# Patient Record
Sex: Female | Born: 1935 | Race: White | Hispanic: No | Smoking: Never smoker
Health system: Southern US, Community
[De-identification: ages and names within clinical notes are randomized; demographics above are authoritative.]

## PROBLEM LIST (undated history)

## (undated) DIAGNOSIS — I2510 Atherosclerotic heart disease of native coronary artery without angina pectoris: Secondary | ICD-10-CM

## (undated) DIAGNOSIS — F0390 Unspecified dementia without behavioral disturbance: Secondary | ICD-10-CM

## (undated) DIAGNOSIS — M1990 Unspecified osteoarthritis, unspecified site: Secondary | ICD-10-CM

## (undated) DIAGNOSIS — E785 Hyperlipidemia, unspecified: Secondary | ICD-10-CM

## (undated) DIAGNOSIS — I4891 Unspecified atrial fibrillation: Secondary | ICD-10-CM

## (undated) DIAGNOSIS — I2699 Other pulmonary embolism without acute cor pulmonale: Secondary | ICD-10-CM

## (undated) DIAGNOSIS — E079 Disorder of thyroid, unspecified: Secondary | ICD-10-CM

## (undated) DIAGNOSIS — I10 Essential (primary) hypertension: Secondary | ICD-10-CM

## (undated) DIAGNOSIS — I219 Acute myocardial infarction, unspecified: Secondary | ICD-10-CM

## (undated) DIAGNOSIS — F0280 Dementia in other diseases classified elsewhere without behavioral disturbance: Secondary | ICD-10-CM

## (undated) DIAGNOSIS — G309 Alzheimer's disease, unspecified: Secondary | ICD-10-CM

## (undated) DIAGNOSIS — F419 Anxiety disorder, unspecified: Secondary | ICD-10-CM

## (undated) DIAGNOSIS — K219 Gastro-esophageal reflux disease without esophagitis: Secondary | ICD-10-CM

## (undated) DIAGNOSIS — K449 Diaphragmatic hernia without obstruction or gangrene: Secondary | ICD-10-CM

## (undated) DIAGNOSIS — K5730 Diverticulosis of large intestine without perforation or abscess without bleeding: Secondary | ICD-10-CM

## (undated) HISTORY — PX: ABDOMINAL SURGERY: SHX537

## (undated) HISTORY — PX: CHOLECYSTECTOMY: SHX55

---

## 2001-01-15 ENCOUNTER — Ambulatory Visit (HOSPITAL_COMMUNITY): Admission: RE | Admit: 2001-01-15 | Discharge: 2001-01-15 | Payer: Self-pay | Admitting: Internal Medicine

## 2001-01-15 ENCOUNTER — Encounter: Payer: Self-pay | Admitting: Internal Medicine

## 2001-12-01 ENCOUNTER — Emergency Department (HOSPITAL_COMMUNITY): Admission: EM | Admit: 2001-12-01 | Discharge: 2001-12-01 | Payer: Self-pay | Admitting: *Deleted

## 2001-12-01 ENCOUNTER — Encounter: Payer: Self-pay | Admitting: *Deleted

## 2002-01-16 ENCOUNTER — Encounter: Payer: Self-pay | Admitting: Internal Medicine

## 2002-01-16 ENCOUNTER — Ambulatory Visit (HOSPITAL_COMMUNITY): Admission: RE | Admit: 2002-01-16 | Discharge: 2002-01-16 | Payer: Self-pay | Admitting: Internal Medicine

## 2002-02-28 ENCOUNTER — Ambulatory Visit (HOSPITAL_COMMUNITY): Admission: RE | Admit: 2002-02-28 | Discharge: 2002-02-28 | Payer: Self-pay | Admitting: Internal Medicine

## 2002-02-28 ENCOUNTER — Encounter (INDEPENDENT_AMBULATORY_CARE_PROVIDER_SITE_OTHER): Payer: Self-pay | Admitting: Internal Medicine

## 2002-04-09 ENCOUNTER — Ambulatory Visit (HOSPITAL_COMMUNITY): Admission: RE | Admit: 2002-04-09 | Discharge: 2002-04-09 | Payer: Self-pay | Admitting: Internal Medicine

## 2002-04-09 ENCOUNTER — Encounter (INDEPENDENT_AMBULATORY_CARE_PROVIDER_SITE_OTHER): Payer: Self-pay | Admitting: Internal Medicine

## 2002-09-08 ENCOUNTER — Encounter: Payer: Self-pay | Admitting: *Deleted

## 2002-09-08 ENCOUNTER — Emergency Department (HOSPITAL_COMMUNITY): Admission: EM | Admit: 2002-09-08 | Discharge: 2002-09-08 | Payer: Self-pay | Admitting: Emergency Medicine

## 2002-11-25 ENCOUNTER — Ambulatory Visit (HOSPITAL_COMMUNITY): Admission: RE | Admit: 2002-11-25 | Discharge: 2002-11-25 | Payer: Self-pay | Admitting: Internal Medicine

## 2003-01-22 ENCOUNTER — Ambulatory Visit (HOSPITAL_COMMUNITY): Admission: RE | Admit: 2003-01-22 | Discharge: 2003-01-22 | Payer: Self-pay | Admitting: Internal Medicine

## 2003-01-22 ENCOUNTER — Encounter: Payer: Self-pay | Admitting: Internal Medicine

## 2003-02-20 ENCOUNTER — Ambulatory Visit (HOSPITAL_COMMUNITY): Admission: RE | Admit: 2003-02-20 | Discharge: 2003-02-20 | Payer: Self-pay | Admitting: Internal Medicine

## 2003-02-23 ENCOUNTER — Ambulatory Visit (HOSPITAL_COMMUNITY): Admission: RE | Admit: 2003-02-23 | Discharge: 2003-02-23 | Payer: Self-pay | Admitting: Internal Medicine

## 2003-07-15 ENCOUNTER — Emergency Department (HOSPITAL_COMMUNITY): Admission: EM | Admit: 2003-07-15 | Discharge: 2003-07-15 | Payer: Self-pay | Admitting: Emergency Medicine

## 2003-12-03 ENCOUNTER — Emergency Department (HOSPITAL_COMMUNITY): Admission: EM | Admit: 2003-12-03 | Discharge: 2003-12-03 | Payer: Self-pay | Admitting: Emergency Medicine

## 2004-01-06 ENCOUNTER — Encounter (HOSPITAL_COMMUNITY): Admission: RE | Admit: 2004-01-06 | Discharge: 2004-02-05 | Payer: Self-pay | Admitting: Orthopaedic Surgery

## 2004-02-15 ENCOUNTER — Ambulatory Visit (HOSPITAL_COMMUNITY): Admission: RE | Admit: 2004-02-15 | Discharge: 2004-02-15 | Payer: Self-pay | Admitting: Internal Medicine

## 2004-03-16 ENCOUNTER — Ambulatory Visit (HOSPITAL_COMMUNITY): Admission: RE | Admit: 2004-03-16 | Discharge: 2004-03-16 | Payer: Self-pay | Admitting: Internal Medicine

## 2004-09-03 ENCOUNTER — Emergency Department (HOSPITAL_COMMUNITY): Admission: EM | Admit: 2004-09-03 | Discharge: 2004-09-03 | Payer: Self-pay | Admitting: Emergency Medicine

## 2004-11-30 ENCOUNTER — Ambulatory Visit: Payer: Self-pay | Admitting: Internal Medicine

## 2005-02-16 ENCOUNTER — Ambulatory Visit (HOSPITAL_COMMUNITY): Admission: RE | Admit: 2005-02-16 | Discharge: 2005-02-16 | Payer: Self-pay | Admitting: Internal Medicine

## 2005-04-16 ENCOUNTER — Ambulatory Visit: Admission: RE | Admit: 2005-04-16 | Discharge: 2005-04-16 | Payer: Self-pay | Admitting: Internal Medicine

## 2005-04-20 ENCOUNTER — Ambulatory Visit: Payer: Self-pay | Admitting: Pulmonary Disease

## 2005-07-24 ENCOUNTER — Ambulatory Visit (HOSPITAL_COMMUNITY): Admission: RE | Admit: 2005-07-24 | Discharge: 2005-07-24 | Payer: Self-pay | Admitting: Ophthalmology

## 2005-09-19 ENCOUNTER — Emergency Department (HOSPITAL_COMMUNITY): Admission: EM | Admit: 2005-09-19 | Discharge: 2005-09-19 | Payer: Self-pay | Admitting: Emergency Medicine

## 2005-12-28 ENCOUNTER — Ambulatory Visit: Payer: Self-pay | Admitting: Internal Medicine

## 2006-02-19 ENCOUNTER — Ambulatory Visit (HOSPITAL_COMMUNITY): Admission: RE | Admit: 2006-02-19 | Discharge: 2006-02-19 | Payer: Self-pay | Admitting: Internal Medicine

## 2006-06-18 ENCOUNTER — Ambulatory Visit (HOSPITAL_COMMUNITY): Admission: RE | Admit: 2006-06-18 | Discharge: 2006-06-18 | Payer: Self-pay | Admitting: Internal Medicine

## 2006-07-10 ENCOUNTER — Emergency Department (HOSPITAL_COMMUNITY): Admission: EM | Admit: 2006-07-10 | Discharge: 2006-07-10 | Payer: Self-pay | Admitting: Emergency Medicine

## 2006-07-12 ENCOUNTER — Encounter: Payer: Self-pay | Admitting: Emergency Medicine

## 2006-07-13 ENCOUNTER — Inpatient Hospital Stay (HOSPITAL_COMMUNITY): Admission: EM | Admit: 2006-07-13 | Discharge: 2006-07-18 | Payer: Self-pay | Admitting: Emergency Medicine

## 2006-07-18 ENCOUNTER — Inpatient Hospital Stay: Admission: AD | Admit: 2006-07-18 | Discharge: 2006-09-13 | Payer: Self-pay | Admitting: Internal Medicine

## 2006-08-08 ENCOUNTER — Emergency Department (HOSPITAL_COMMUNITY): Admission: EM | Admit: 2006-08-08 | Discharge: 2006-08-08 | Payer: Self-pay | Admitting: Emergency Medicine

## 2006-08-09 ENCOUNTER — Encounter (HOSPITAL_COMMUNITY): Admission: RE | Admit: 2006-08-09 | Discharge: 2006-09-08 | Payer: Self-pay | Admitting: Emergency Medicine

## 2006-12-13 ENCOUNTER — Ambulatory Visit: Payer: Self-pay | Admitting: Internal Medicine

## 2007-03-07 ENCOUNTER — Ambulatory Visit (HOSPITAL_COMMUNITY): Admission: RE | Admit: 2007-03-07 | Discharge: 2007-03-07 | Payer: Self-pay | Admitting: Internal Medicine

## 2008-11-13 ENCOUNTER — Ambulatory Visit: Payer: Self-pay | Admitting: Internal Medicine

## 2008-11-13 ENCOUNTER — Encounter: Payer: Self-pay | Admitting: Internal Medicine

## 2008-11-13 ENCOUNTER — Observation Stay (HOSPITAL_COMMUNITY): Admission: EM | Admit: 2008-11-13 | Discharge: 2008-11-14 | Payer: Self-pay | Admitting: Emergency Medicine

## 2008-11-16 ENCOUNTER — Telehealth (INDEPENDENT_AMBULATORY_CARE_PROVIDER_SITE_OTHER): Payer: Self-pay | Admitting: *Deleted

## 2008-11-16 ENCOUNTER — Emergency Department (HOSPITAL_COMMUNITY): Admission: EM | Admit: 2008-11-16 | Discharge: 2008-11-16 | Payer: Self-pay | Admitting: Emergency Medicine

## 2008-11-17 ENCOUNTER — Encounter: Payer: Self-pay | Admitting: Internal Medicine

## 2008-11-18 ENCOUNTER — Encounter: Payer: Self-pay | Admitting: Internal Medicine

## 2008-11-19 ENCOUNTER — Ambulatory Visit (HOSPITAL_COMMUNITY): Admission: RE | Admit: 2008-11-19 | Discharge: 2008-11-19 | Payer: Self-pay | Admitting: Internal Medicine

## 2008-11-27 ENCOUNTER — Encounter: Payer: Self-pay | Admitting: Internal Medicine

## 2009-01-07 DIAGNOSIS — E039 Hypothyroidism, unspecified: Secondary | ICD-10-CM | POA: Insufficient documentation

## 2009-01-07 DIAGNOSIS — K5730 Diverticulosis of large intestine without perforation or abscess without bleeding: Secondary | ICD-10-CM | POA: Insufficient documentation

## 2009-01-07 DIAGNOSIS — Z8719 Personal history of other diseases of the digestive system: Secondary | ICD-10-CM

## 2009-01-07 DIAGNOSIS — K3189 Other diseases of stomach and duodenum: Secondary | ICD-10-CM

## 2009-01-07 DIAGNOSIS — K589 Irritable bowel syndrome without diarrhea: Secondary | ICD-10-CM

## 2009-01-07 DIAGNOSIS — R1013 Epigastric pain: Secondary | ICD-10-CM

## 2009-01-07 DIAGNOSIS — Z8659 Personal history of other mental and behavioral disorders: Secondary | ICD-10-CM

## 2009-01-08 ENCOUNTER — Ambulatory Visit: Payer: Self-pay | Admitting: Internal Medicine

## 2009-01-08 DIAGNOSIS — K2970 Gastritis, unspecified, without bleeding: Secondary | ICD-10-CM | POA: Insufficient documentation

## 2009-01-08 DIAGNOSIS — R1013 Epigastric pain: Secondary | ICD-10-CM

## 2009-01-08 DIAGNOSIS — K2990 Gastroduodenitis, unspecified, without bleeding: Secondary | ICD-10-CM

## 2009-01-26 ENCOUNTER — Ambulatory Visit (HOSPITAL_COMMUNITY): Admission: RE | Admit: 2009-01-26 | Discharge: 2009-01-26 | Payer: Self-pay | Admitting: Family Medicine

## 2009-03-05 ENCOUNTER — Ambulatory Visit: Payer: Self-pay | Admitting: Internal Medicine

## 2009-08-09 ENCOUNTER — Encounter: Payer: Self-pay | Admitting: Urgent Care

## 2010-03-04 ENCOUNTER — Ambulatory Visit (HOSPITAL_COMMUNITY)
Admission: RE | Admit: 2010-03-04 | Discharge: 2010-03-04 | Payer: Self-pay | Source: Home / Self Care | Admitting: Family Medicine

## 2010-04-23 ENCOUNTER — Encounter: Payer: Self-pay | Admitting: Family Medicine

## 2010-05-05 NOTE — Medication Information (Signed)
Summary: RX Folder  RX Folder   Imported By: Peggyann Shoals 08/09/2009 08:52:59  _____________________________________________________________________  External Attachment:    Type:   Image     Comment:   External Document  Appended Document: RX FolderPANTOPRAZOLE    Prescriptions: PANTOPRAZOLE SODIUM 40 MG TBEC (PANTOPRAZOLE SODIUM) one by mouth every morning for stomach  #30 x 11   Entered and Authorized by:   Joselyn Arrow FNP-BC   Signed by:   Joselyn Arrow FNP-BC on 08/09/2009   Method used:   Electronically to        [facility] (retail)       [address]       [place], [place]  [address]       Ph: [phone]       Fax: [phone]   RxID:   [id number]

## 2010-07-09 LAB — APTT: aPTT: 26 seconds (ref 24–37)

## 2010-07-09 LAB — URINALYSIS, ROUTINE W REFLEX MICROSCOPIC
Ketones, ur: NEGATIVE mg/dL
Nitrite: NEGATIVE
pH: 7 (ref 5.0–8.0)

## 2010-07-09 LAB — COMPREHENSIVE METABOLIC PANEL
AST: 21 U/L (ref 0–37)
Albumin: 3.3 g/dL — ABNORMAL LOW (ref 3.5–5.2)
Albumin: 3.6 g/dL (ref 3.5–5.2)
BUN: 10 mg/dL (ref 6–23)
Calcium: 8.8 mg/dL (ref 8.4–10.5)
Calcium: 9.3 mg/dL (ref 8.4–10.5)
Creatinine, Ser: 1.07 mg/dL (ref 0.4–1.2)
Creatinine, Ser: 1.13 mg/dL (ref 0.4–1.2)
GFR calc Af Amer: 59 mL/min — ABNORMAL LOW (ref >60)
Total Protein: 6.2 g/dL (ref 6.0–8.3)
Total Protein: 6.7 g/dL (ref 6.0–8.3)

## 2010-07-09 LAB — DIFFERENTIAL
Eosinophils Relative: 1 % (ref 0–5)
Lymphocytes Relative: 29 % (ref 12–46)
Lymphocytes Relative: 36 % (ref 12–46)
Lymphs Abs: 2.3 10*3/uL (ref 0.7–4.0)
Monocytes Absolute: 0.6 10*3/uL (ref 0.1–1.0)
Monocytes Absolute: 0.7 10*3/uL (ref 0.1–1.0)
Monocytes Relative: 9 % (ref 3–12)
Neutro Abs: 4.5 10*3/uL (ref 1.7–7.7)

## 2010-07-09 LAB — HEPATIC FUNCTION PANEL
AST: 24 U/L (ref 0–37)
Albumin: 3.4 g/dL — ABNORMAL LOW (ref 3.5–5.2)
Alkaline Phosphatase: 67 U/L (ref 39–117)
Total Bilirubin: 0.9 mg/dL (ref 0.3–1.2)
Total Protein: 6.5 g/dL (ref 6.0–8.3)

## 2010-07-09 LAB — CBC
HCT: 44.5 % (ref 36.0–46.0)
MCHC: 33.8 g/dL (ref 30.0–36.0)
MCHC: 33.9 g/dL (ref 30.0–36.0)
MCV: 88.5 fL (ref 78.0–100.0)
MCV: 88.6 fL (ref 78.0–100.0)
Platelets: 212 10*3/uL (ref 150–400)
Platelets: 215 10*3/uL (ref 150–400)
RDW: 14.5 % (ref 11.5–15.5)

## 2010-07-09 LAB — POCT CARDIAC MARKERS
CKMB, poc: 1.4 ng/mL (ref 1.0–8.0)
Myoglobin, poc: 101 ng/mL (ref 12–200)
Troponin i, poc: <0.05 ng/mL (ref 0.00–0.09)
Troponin i, poc: <0.05 ng/mL (ref 0.00–0.09)

## 2010-07-09 LAB — LACTIC ACID, PLASMA: Lactic Acid, Venous: 1.3 mmol/L (ref 0.5–2.2)

## 2010-07-09 LAB — URINE CULTURE: Colony Count: NO GROWTH

## 2010-07-09 LAB — LIPASE, BLOOD: Lipase: 34 U/L (ref 11–59)

## 2010-08-16 NOTE — Op Note (Signed)
NAMEMILLY, [name]                 ACCOUNT NO.:  [id number]   MEDICAL RECORD NO.:  [id number]          PATIENT TYPE:  INP   LOCATION:  A201                          FACILITY:  [facility]   PHYSICIAN:  R. Roetta Sessions, M.D. DATE OF BIRTH:  05-24-1922   DATE OF PROCEDURE:  11/13/2008  DATE OF DISCHARGE:                               OPERATIVE REPORT   PROCEDURE:  Esophagogastroduodenoscopy with biopsy.   INDICATIONS FOR PROCEDURE:  An 77 year old lady admitted to the hospital  with severe epigastric pain yesterday which has now resolved.  Workup  thus far has demonstrated some dilation of her biliary tree on  ultrasound and CT without obvious cause being seen.  Her LFTs were  normal.  It is notable that she has biliary dilation since imaging in  2005.  The gallbladder is out.  I reviewed the CT with Dr. Roxy Horseman  today.  Although she does have some vascular plaquing, she has patent  mesenteric blood vessels.  She does have significant underlying  dementia.  EGD is now being done to sort out whether or not she has any  intraluminal GI pathology in her upper GI tract to account for her  symptoms.  This approach has been discussed with the patient and the  patient's son at length.  Risks, benefits, and alternatives have been  reviewed and questions answered.  Please see the documentation in the  medical record.   PROCEDURE NOTE:  O2 saturation, blood pressure, pulse, and respirations  were monitored throughout the entire procedure.  Conscious sedation:  Versed 2 mg IV, Demerol 50 mg IV in incremental doses.  Cetacaine spray  for topical pharyngeal anesthesia.  Instrument:  Pentax video chip  system.   FINDINGS:  Tubular esophagus revealed normal-appearing mucosa, slightly  patulous EG junction.  Stomach:  Gastric cavity was emptied and insufflated well with air.  Thorough examination of the gastric mucosa including retroflexion of the  proximal stomach-esophagogastric junction  demonstrated a small hiatal  hernia and rather mottled patchy erythema of the gastric mucosa more  intensely located in the antrum.  There was a small patch of pale  verrucous-appearing mucosa just above the pylorus of uncertain  significance.  There was no ulcer or infiltrating process.  The pylorus  was patent and easily traversed.  Exam of the bulb and second portion  revealed a large juxta-ampullary duodenal diverticulum.   There were also multiple 1-2 mm fundal gland polyps in the proximal  stomach which were not manipulated.  Biopsies of the verrucous-appearing  antral mucosa was taken for histologic study and biopsies of the antrum  elsewhere were also taken.  The patient tolerated the procedure well and  was reacted in endoscopy.   IMPRESSION:  1. Normal-appearing esophagus, patulous EG junction.  2. Hiatal hernia.  3. Fundal gland-type polyps not manipulated.  4. Patchy erythema and mottling of the gastric mucosa more pronounced      in the antrum.  Focal area of verrucous-appearing mucosa just above      the pylorus, biopsied separately.  5. Patent pylorus.  6. D2 diverticulum  as described above.   Today's findings would not explain her recent acute bout of abdominal  pain.   After talking to her son, she has had some stuttering, less severe  episodes abdominal pain off and on over the past 6 months.   Therefore, I believe we need to consider that her biliary dilation seen  on CT is likely significant.   RECOMMENDATIONS:  1. We will advance diet and we will pursue an MRCP, November 14, 2008.  2. Dr. Dionicia Abler is on-call for our group over the upcoming weekend.      Jonathon Bellows, M.D.  Electronically Signed     RMR/MEDQ  D:  11/13/2008  T:  11/13/2008  Job:  [id number]

## 2010-08-16 NOTE — Assessment & Plan Note (Signed)
NAME:  [name], [name]                  CHART#:  [id number]   DATE:  12/13/2006                       DOB:  1922/06/23   Previously a Dr. Dionicia Abler patient, now switched over to me.   HISTORY OF PRESENT ILLNESS:  The patient is a pleasant 77 year old lady  with non ulcer dyspepsia, known diverticulosis, history of H. Pylori  gastritis treated 2 years ago and irritable bowel syndrome. She was last  seen here 12/28/2005.  The dyspepsia symptoms have been well controlled  on Cimetidine.  She has had difficulties with a slant towards diarrhea,  worse over the last several months. Has not been exposed to antibiotics  and sometimes she has 2 to 3 bowel movements in the middle of the night.  Last colonoscopy in 2004 which revealed a left sided diverticula.  She  had H. pylori gastritis treated with a triple drug therapy previously.  The last EGD was in 2003.  Her dietary intake is felt to be so so.  Sometimes she just gets filled up early but is not consistent. Weight  was 134 pounds back on 11/30/2004.  Up to 146 on 12/28/2005 and now down  to 140-1/2 on 12/13/2006.  She is accompanied by her daughter today.   PERTINENT MEDICAL HISTORY:  History of dementia. Status post cervical  laminectomy previously.  Unilateral  oophorectomy.  Hysterectomy.  History of colonic diverticulosis.  Hypothyroidism.   CURRENT MEDICATIONS:  1. Aricept.  2. Lovenox.  3. Cimetidine.  4. Synthroid.  5. Clonazepam.  6. Lactulose 1 to 2 tablespoons at bedtime.   ALLERGIES:  No known drug allergies.   FAMILY HISTORY:  Noncontributory.   SOCIAL HISTORY:  Lives with her daughter.  No tobacco.  No alcohol.   REVIEW OF SYSTEMS:  Has not had any chest pain, dyspnea, fever, or  chills.  Otherwise as in history of present illness.   PHYSICAL EXAMINATION:  GENERAL: Reveals a pleasant 77 year old lady,  well groomed. Accompanied by her daughter.  VITAL SIGNS:  Weight 140.5.  Height 5 feet 6.  Temperature 97.2. Blood  pressure 100/70.  Pulse 60.  SKIN:  Warm and dry. There is no jaundice.  CHEST:  Lungs are clear to auscultation.  CARDIOVASCULAR:  Regular rate and rhythm without murmurs, gallops, or  rubs.  ABDOMEN:  Nondistended.  Positive bowel sounds.  No bruits.  Soft.  No  obvious mass or organomegaly.  EXTREMITIES:  Exam, no edema.   ASSESSMENT:  The patient is a pleasant elderly lady with a history of  irritable bowel syndrome and diverticulosis.  On 2004 colonoscopy. Has  irritable bowel syndrome.  Her dyspeptic symptoms are quiescent on  Cimetidine.  She has had worsening of diarrhea recently.  She has been  on lactulose for some time,  Dr. Dionicia Abler.  She brought the bottle in with  her and Dr. Inge Rise name is on the bottle.  However, it is interesting  that I do not see in any of her notes where this was ever prescribed.  Although, it is certainly conceivable along the way when she had a shift  towards constipation this agent was instituted.  She is not on any fiber  supplementation.  At that point, no doubt the lactulose is contributing  to her diarrhea.   RECOMMENDATIONS:  1. Stop lactulose. Would back  off on Lovenox only to an as needed      basis.  2. She really ought to be on a fiber supplement.  This may keep her      bowels function more middle of the road.  I have given her samples      of Metamucil. She should take one dose of Metamucil with adequate      fluids every day, no matter what.  I think a Metamucil is a one a      day vitamin for her bowels.  Daughter is to call us in 1 week and      let us know how things are going.  We will base further      recommendations on progress report at that time.       Jonathon Bellows, M.D.  Electronically Signed     RMR/MEDQ  D:  12/13/2006  T:  12/14/2006  Job:  [id number]   cc:   Madelin Rear. Sherwood Gambler, MD

## 2010-08-16 NOTE — H&P (Signed)
[name], [name]                 ACCOUNT NO.:  [id number]   MEDICAL RECORD NO.:  [id number]          PATIENT TYPE:  INP   LOCATION:  A201                          FACILITY:  [facility]   PHYSICIAN:  Margaretmary Dys, M.D.DATE OF BIRTH:  08-31-1922   DATE OF ADMISSION:  11/12/2008  DATE OF DISCHARGE:  LH                              HISTORY & PHYSICAL   ADMISSION DIAGNOSES:  1. Acute abdominal pain.  2. History of hiatal hernia.  3. History of severe extensive diverticulosis.  4. Incidental finding of dilated hepatic ducts of unclear etiology.  5. History of severe erosive reflux esophagitis with a small sliding      hiatal hernia as mentioned above.   CHIEF COMPLAINTS:  Acute abdominal pain.   HISTORY OF PRESENT ILLNESS:  [name] is a pleasant 77 year old female  who presents with abdominal pain.  The patient reports that abdominal  pain started about a day ago.  This is something that she deals with  intermittently.  She reports the pain as throbbing in nature and mostly  located in her epigastrium with no radiation.  The patient denies any  nausea, vomiting, diarrhea, fevers or chills.  She denies any back or  flank pain.  Pain is not exacerbated by any issues.  It is just relieved  intermittently on its own.  The patient presented to the emergency room  where she reported the pain to be 10 out of 10.  She received some pain  medication which showed caused improvement.  When I saw the patient, the  patient reported that her pain was 2/10 and she was fairly certain that  this was mostly from her hiatal hernia for which she actually had an EGD  done back in 2003 which I reviewed done by Dr. Karilyn Cota which showed  evidence of severe erosive esophagitis.   REVIEW OF SYSTEMS:  As mentioned in history of present illness above.   PAST MEDICAL HISTORY:  1. History of severe erosive esophagitis and gastritis.  2. History of gastroesophageal reflux disease.  3. Hypertension.  4. Severe  extensive diverticulosis.  5. Hypothyroidism.  6. Hypercholesterolemia.  7. History of right distal radius fracture in November.  8. History of left hip femoral neck fracture displaced in 2008.   MEDICATIONS:  1. Synthroid 50 mcg p.o. once daily.  2. Aricept, exact dose not known.   ALLERGIES:  No known drug allergies.   FAMILY HISTORY:  Noncontributory.   SOCIAL HISTORY:  The patient lives at home.  She is somewhat independent  of activities of daily living, although she is very hard of hearing.  No  alcohol or smoking history.   PHYSICAL EXAMINATION:  GENERAL:  The patient was conscious, alert and  comfortable, not in acute distress.  She was well oriented to time,  place and person, although the patient is very hard of hearing.  VITAL SIGNS:  Blood pressure 139/70, pulse 64, respirations 20,  temperature 97.2 degrees Fahrenheit, oxygen saturation 94% on room air.  HEENT:  Normocephalic, atraumatic.  Oral mucosa was moist with no  exudates.  NECK:  Supple.  No JVD or lymphadenopathy.  LUNGS:  Clear clinically with good air entry bilaterally.  HEART:  S1 and S2 regular.  No excessive gallops or rubs.  ABDOMEN:  Soft, nontender.  Bowel sounds positive.  Nondistended.  There  was no real rebound or guarding especially in the epigastrium.  EXTREMITIES:  No pitting pedal edema.  No calf induration or tenderness  was noted.  CNS:  The patient is awake, alert and following commands.  She is very  hard of hearing.  She has no focal neurological deficits.   LABORATORY AND DIAGNOSTIC DATA:  White blood cell count 6.5, hemoglobin  14.9, hematocrit 43.8, platelet count 215,000 with no left shift.  PT  13.3, INR 1, PTT 26.  Sodium 138, potassium 4, chloride 105, CO2 26,  glucose 89, BUN 14, creatinine 1.07, bilirubin 0.6, AST 21, ALT 15,  total protein 6.7, albumin 3.6, calcium 9.3, lactic acid 1.3, lipase 34.  Cardiac markers were negative.  Urinalysis was negative.  CT scan of the   abdomen and pelvis shows severe extensive diverticulosis through the  rectosigmoid area.  CT scan of the abdomen does show some dilated  intrahepatic ducts.  Recommendation for ERCP for further evaluation was  made.   ASSESSMENT AND PLAN:  [name] is a pleasant 77 year old female who  presents with acute abdominal pain worse in the epigastrium consistent  with a history of severe erosive gastritis and esophagitis.  The patient  denies any recent use of nonsteroidal anti-inflammatory drugs or alcohol  or any other potential precipitating factors.  The patient is also  slightly dehydrated.   PLAN:  1. The patient will be admitted to the medical floor.  2. Will institute IV fluids with normal saline at 75 cc/hr.  3. Will keep the patient n.p.o. for now.  4. Will institute the patient on Protonix 440 mg IV q.12 h.  5. Request GI consult for further evaluation of the abnormal CT scan      and possibly followup EGD.  As a result, the patient is being kept      n.p.o. for now.  6. Will obtain current doses of home medications.  7. Will put on DVT prophylaxis with Lovenox.  8. Will control pain p.r.n. with Dilaudid.  9. Will also control potential nausea and vomiting with Zofran and      Phenergan.   I have discussed the above plan with the patient and that this was  likely her hernia with gastric with severe gastroesophageal reflux  disease, but she does have abnormal CT scan, and we will be evaluating  her further.  She verbalized full understanding.      Margaretmary Dys, M.D.  Electronically Signed     AM/MEDQ  D:  11/13/2008  T:  11/13/2008  Job:  [id number]

## 2010-08-16 NOTE — Consult Note (Signed)
[name], [name]                 ACCOUNT NO.:  [id number]   MEDICAL RECORD NO.:  [id number]          PATIENT TYPE:  INP   LOCATION:  A201                          FACILITY:  [facility]   PHYSICIAN:  R. Roetta Sessions, M.D. DATE OF BIRTH:  [birth year]   DATE OF CONSULTATION:  11/13/2008  DATE OF DISCHARGE:                                 CONSULTATION   REFERRING PHYSICIAN:  [facility], Dr. Margaretmary Dys.   REASON FOR CONSULTATION:  1. Intrahepatic biliary dilation.  2. Severe gastroesophageal reflux disease ? abdominal pain.   HISTORY OF PRESENT ILLNESS:  The patient is an 77 year old Caucasian  female who presented with acute onset abdominal pain of less than 24  hours duration.  She woke up yesterday morning with severe and  excruciating epigastric pain.  She was unable to get out of the bed.  She was screaming and writhing in pain.  She called her son who brought  her to the emergency department.  She had no vomiting, no diarrhea.  She  was given pain medication in the ER and this resulted in resolution of  her abdominal pain.  This morning she is pain free.  She states she has  had some intermittent abdominal pain in the past but nothing as severe  as this.  She states she has hiatal hernia that she belies often gives  her some trouble.  She denies heartburn, dysphagia, odynophagia.  She  generally has a couple of bowel movements a day.  She has chronic  intermittent diarrhea due to IBS.  Denies any melena or rectal bleeding.  Denies any weight loss.  Upon evaluation she had a CT of the head and  pelvis that showed a new dilation of the intrahepatic ducts since 2005.  Her pancreatic duct was prominent throughout the gland.  She also a  large descending duodenum diverticulum, multiple rectosigmoid colonic  tics.  Chest x-ray revealed minimal cardiac enlargement and questionable  COPD.  Her LFTs been normal except for albumin in the low 3 range.  Her  amylase and lipase  were normal.  UA was negative.  CBC negative.  INR  1__________ normal.  Two-point of care cardiac enzymes were negative as  well.   MEDICATIONS AT HOME:  Synthroid 50 mcg daily, Aricept daily.   ALLERGIES:  In no known drug allergies.   PAST MEDICAL HISTORY:  1. She has a history of gastroesophageal reflux disease, currently not      on any medication.  She had EGD in 2003 and had erosive reflux      esophagitis, and nonerosive antral gastritis and was treated for H.      Pylori, I believe, around 2006.  2. Hypertension.  3. Diverticulosis.  4. Hypothyroidism.  5. Hyperlipidemia.  6. Mild dementia, currently able to live alone in and perform ADLs.  7. IBS:  Previously followed by Dr. Loreta Ave.  Her last colonoscopy was in      2004 she had left-sided of colonic diverticulosis and external      hemorrhoids.  8. She has had a right  distal radius fracture.  She had a left hip      femoral neck displaced fracture with surgery in 2008.  9. She is hard of hearing, wears hearing aids.  10.She has a history of H. pylori gastritis, status post treatment      1006.   PAST SURGICAL HISTORY:  1. Cervical laminectomy.  2. Cholecystectomy.  3. Unilateral oophorectomy.  4. She states she had one of her tonsils removed for possible cancer      years ago.   FAMILY HISTORY:  Mother deceased due to stroke at age 64.  Father  deceased age 30 with CAD.  Brother had head and neck cancer.  Another  brother had a brother had unknown type cancer.  Another brother had  prostate cancer.  No family history of colon cancer to her knowledge.   SOCIAL HISTORY:  She lives alone.  She has a son who lives near by.  Her  daughter lives in [place].  She denies any tobacco or alcohol use.   REVIEW OF SYSTEMS:  See HPI for GI.  CONSTITUTIONAL:  Denies weight  loss.  CARDIOPULMONARY:  Denies chest pain, shortness of breath,  palpitations or cough.  GENITOURINARY:  Denies dysuria, hematuria.   PHYSICAL  EXAMINATION:  VITAL SIGNS:  Temperature  96.9, pulse 55,  respirations 20, blood pressure 117/74, O2 sats 95% on room air.  Weight  66.2 kg.  GENERAL:  Pleasant, elderly, hard of hearing Caucasian female  in no acute distress.  She is able to communicate appropriately with  hearing aids however.  SKIN:  Warm and dry.  No jaundice.  HEENT:  Sclerae nonicteric.  Oropharyngeal is moist and pink.  No  lesions, erythema or exudate.  No lymphadenopathy, thyromegaly.  CHEST:  Lungs are clear auscultation.  CARDIAC:  Exam reveals regular rate and rhythm.  No murmurs.  ABDOMEN:  Positive bowel sounds.  Soft, nontender, nondistended.  No  organomegaly or masses.  No rebound or guarding.  No abdominal bruits or  hernias.  LOWER EXTREMITIES:  No edema.   LABORATORY DATA:  As mentioned above.   X-RAYS:  As above.  In addition she just had abdominal ultrasound that  showed again mild intrahepatic biliary dilation and mild pancreatic duct  dilation.  Her CBD diameter was 9.5 mm,  not well visualized.  There is  an echogenic foci near the duct which could be related to  cholecystectomy clips or bowel gas.  No definite intraductal calculi  seen.   IMPRESSION:  The patient is an 77-year lady with acute severe epigastric  pain which is now resolved.  She denies any chronic daily heartburn  symptoms.  She does have a history of remote erosive reflux esophagitis.  Would question whether her pain was secondary to hiatal hernia with  intermittent ischemia.  Her biliary dilation may very well be an  incidental finding as well as well as her pancreatic ductal prominence.  Her LFTs and lipase have remained normal.   RECOMMENDATIONS:  1. Will discuss further with Dr. Jena Gauss regarding a possibility of EGD.  2. She will likely need an MRCP as the next step to further evaluate      biliary and pancreatic ductal dilation.  3. Further recommendations to follow.      Tana Coast, P.AJonathon Bellows, M.D.  Electronically Signed    LL/MEDQ  D:  11/13/2008  T:  11/13/2008  Job:  [id number]   cc:  Margaretmary Dys, M.D.   [facility]

## 2010-08-16 NOTE — Discharge Summary (Signed)
[name], [name]                 ACCOUNT NO.:  [id number]   MEDICAL RECORD NO.:  [id number]          PATIENT TYPE:  INP   LOCATION:  A201                          FACILITY:  [facility]   PHYSICIAN:  Thad Ranger, MD       DATE OF BIRTH:  06/02/1922   DATE OF ADMISSION:  11/13/2008  DATE OF DISCHARGE:  08/14/2010LH                               DISCHARGE SUMMARY   DISCHARGE DIAGNOSES:  1. Acute abdominal pain, possible intermittent ischemia.  2. Gastroesophageal reflux disease.   SECONDARY DIAGNOSES:  1. Hypertension.  2. Hypothyroidism.  3. Hyperlipidemia.   CONSULTATIONS:  Gastroenterology, R. Roetta Sessions, MD   HISTORY OF PRESENT ILLNESS AT THE TIME OF ADMISSION:  Ms. Bhullar is an 77-  year-old female who presented with abdominal pain, which started a day  prior to admission.  She did report that she has the abdominal pain  intermittently.  She described the pain as throbbing in nature, mostly  located in the epigastrium with no radiation.  She denied any nausea,  vomiting, diarrhea, fevers, or chills.  She denied any back pain or  flank pain.  Pain was not exacerbated by any issues and intermittently  relieved on its own.  At the time of presentation to emergency room, she  reported the pain to be 10/10.   PAST MEDICAL HISTORY:  1. History of severe erosive esophagitis and gastritis.  2. History of GERD.  3. Hypertension.  4. Severe extensive diverticulosis.  5. Hypothyroidism.  6. Hyperlipidemia.  7. History of right distal radius fracture.  8. History of left hip femoral neck fracture displaced in 2008.   MEDICATIONS PRIOR TO ADMISSION:  1. Synthroid 50 mcg p.o. daily.  2. Aricept daily.   ALLERGIES:  No known drug allergies.   PHYSICAL EXAMINATION AT THE TIME OF ADMISSION:  VITAL SIGNS:  Blood  pressure 139/70, pulse 64, respirations 20, temperature 97.2, and O2 sat  is 94% on room air.  GENERAL:  Physical exam was essentially normal.  ABDOMEN:  At the time of  examination by the admitting hospitalist, was  soft and nontender.  Bowel sounds positive and nondistended.  No real  rebound or guarding in the epigastric region.   LABORATORY AND DIAGNOSTIC DATA:  CBC and chem panel were essentially  normal.  LFTs were normal.  Lactic acid was 1.3 and lipase 34.  Cardiac  markers were normal.  UA was negative for any UTI.  CT scan of the  abdomen and pelvis showed severe extensive diverticulosis through the  rectosigmoid area.  Also, some dilated intrahepatic duct.  EGD done on  November 13, 2008, showed normal-appearing esophagus; hiatal hernia;  fundal gland type polyps; patchy erythema and mottling of gastric mucosa  more pronounced in the antrum; focal area of varicose-appearing mucosa  just above the pylorus, biopsied; patent pylorus, D2 diverticulum.  MRCP  recommended.   BRIEF HOSPITALIZATION COURSE:  Ms. Hipps is an 77 year old female who  was admitted with acute abdominal pain.   Acute abdominal pain, most likely severe gastritis versus intermittent  ischemia.  The patient was placed  on n.p.o. status and started on IV  fluids.  The abdominal pain spontaneously resolved on its own.  Gastroenterology was consulted, and the patient underwent EGD on the day  of admission, which was essentially normal except some patchy erythema  and mottling of the gastric mucosa more pronounced in the antrum.  CT of  the abdomen and pelvis showed mild intra and extra hepatic biliary  dilatation.  Abdominal ultrasound was also done, which confirmed the  similar findings on the CT scan.  MRCP was attempted, however, could not  be completed due to the patient's lack of cooperation.  The patient was  started on oral diet, which she has been tolerating well.  Per GI  recommendations and family's request, MRCP will be pursued outpatient.  The patient will have followup with Dr. Jena Gauss next week.   DISCHARGE MEDICATIONS:  1. Synthroid 50 mcg 1 tab daily.  2. Aricept  10 mg daily at bedtime.  3. Clonazepam 0.5 mg b.i.d.  4. Cimetidine 300 mg b.i.d.  5. New medication, Protonix 40 mg p.o. daily.   DISCHARGE INSTRUCTIONS:  The patient will have MRCP pursued outpatient  with GI.  Discharge followup with Dr. Jena Gauss, Gastroenterology, next  week.      Thad Ranger, MD  Electronically Signed     RR/MEDQ  D:  11/14/2008  T:  11/14/2008  Job:  [id number]

## 2010-08-19 NOTE — Consult Note (Signed)
NAME:  TAMIRRA, SIENKIEWICZ                           ACCOUNT NO.:  [id number]   MEDICAL RECORD NO.:  [id number]                   PATIENT TYPE:  EMS   LOCATION:  ED                                   FACILITY:  [facility]   PHYSICIAN:  J. Darreld Mclean, M.D.              DATE OF BIRTH:  06/24/1922   DATE OF CONSULTATION:  DATE OF DISCHARGE:  12/03/2003                                   CONSULTATION   HISTORY:  The patient is a 77 year old female who fell going into the  [facility] today on the concrete.  She landed on her right wrist.  No  other injuries.  No head injuries, no arm injuries, and no knee injuries.  She had an obvious deformity to the wrist.  She was brought in by ambulance.  X-rays show a displaced fracture of the distal radius.  The patient was  evaluated by Dr. Margretta Ditty first and he contacted me.  I had previously seen  her several years ago for a fracture to the left distal radius.  Neurovascular is intact.   DESCRIPTION OF PROCEDURE:  A 1% plain hematoma block was given and closed  reduction carried out.  She was placed in a sugar tong splint and we are  awaiting x-rays.   IMPRESSION:  Displaced fracture of the right distal radius.   PLAN:  I recommended ice, elevation, and a sling.  A prescription was given  for Darvocet-N 100.  I will see her in the office tomorrow between 8:00 and  10:00 in the morning.  If there is any difficulty, she is to come back to  the emergency room tonight.  Numbers have been provided and a patient  information booklet provided.      ___________________________________________                                            Teola Bradley, M.D.   JWK/MEDQ  D:  12/03/2003  T:  12/03/2003  Job:  [id number]

## 2010-08-19 NOTE — Procedures (Signed)
[name], Tiffany                 ACCOUNT NO.:  [id number]   MEDICAL RECORD NO.:  [id number]          PATIENT TYPE:  OUT   LOCATION:  SLEEP LAB                     FACILITY:  [facility]   PHYSICIAN:  Marcelyn Bruins, M.D. [facility] DATE OF BIRTH:  04-21-1922   DATE OF STUDY:  04/16/2005                              NOCTURNAL POLYSOMNOGRAM   INDICATIONS FOR STUDY:  Persistent disorder of initiating and maintaining  sleep.   EPWORTH SCORE:  1.   SLEEP ARCHITECTURE:  The patient had a total sleep time of 293 minutes with  adequate REM and mildly slow-wave sleep.  Sleep onset latency was normal.  REM onset was somewhat prolonged at 170 minutes.  Sleep efficiency was  decreased at 75%.   RESPIRATORY DATA:  The patient was found to have three hypopneas and two  apneas for a respiratory disturbance index of one event per hour.  The  events were not positional but did seem to occur a little more frequently  during REM.  There was moderate to loud snoring noted.   OXYGEN DATA:  The patient had O2 desaturation as low as 85% with her small  numbers of obstructive events; however, she also was found to have a  wandering baseline between 88% to 91% independent of obstructive events.   CARDIAC DATA:  Clinically-significant cardiac arrhythmia.   MOVEMENTS/PARASOMNIA:  The patient was found to have 13 leg jerks with no  significant sleep disruption.   IMPRESSION/RECOMMENDATIONS:  1.  Small number of obstructive events which do not meet the RDI criteria      for the obstructive sleep apnea syndrome.  Moderate to loud snoring was      noted, however.  2.  O2 desaturation less than 90% was noted during the night, even in the      absence of obstructive sleep apnea.  Consideration could be given to      nocturnal oxygen.  Clinical correlation is suggested.                                            ______________________________  Marcelyn Bruins, M.D. [facility]  Diplomate, American Board of Sleep   Medicine     KC/MEDQ  D:  04/20/2005 18:14:27  T:  04/21/2005 10:49:41  Job:  [id number]

## 2010-08-19 NOTE — Op Note (Signed)
NAMEHAYLI, [name]                 ACCOUNT NO.:  [id number]   MEDICAL RECORD NO.:  [id number]          PATIENT TYPE:  INP   LOCATION:  6707                         FACILITY:  [facility]   PHYSICIAN:  Lubertha Basque. Dalldorf, M.D.DATE OF BIRTH:  Jun 13, 1922   DATE OF PROCEDURE:  07/13/2006  DATE OF DISCHARGE:                               OPERATIVE REPORT   PREOPERATIVE DIAGNOSIS:  Left hip femoral neck fracture.   POSTOPERATIVE DIAGNOSIS:  Left hip femoral neck fracture.   PROCEDURE:  Left hip hemiarthroplasty.   ANESTHESIA:  General.   ATTENDING SURGEON:  Marcene Corning, M.D.   ASSISTANT:  Skip Mayer, P.A.   INDICATIONS FOR PROCEDURE:  The patient is an 77 year old woman who is  an independent liver.  She fell in her yard yesterday and suffered a  displaced femoral neck fracture.  She was taken to an outside hospital  and eventually transferred here today.  She is offered hemiarthroplasty  in hopes of repairing her hip and allowing her to sit and potentially  walk once again.  Informed operative consent was obtained from the  patient and her daughter after thorough discussion of benefits and risks  including reaction to anesthesia, dislocation, DVT, PE, and death.   SUMMARY OF FINDINGS AND PROCEDURE:  Under general anesthesia through a  posterior approach, a left hip hemiarthroplasty was performed.  She had  a displaced femoral neck fracture with fair bone quality.  The  acetabulum was spared.  Unfortunately, she had a fracture which did go  down towards the lesser trochanter, violating a portion of her calcar.  We addressed her problem with a cemented size 4 DePuy basic Summit stem.  We used a -3, 50-mm unipolar fracture head.  We did place a stem  centralizer and used DePuy bone cement including Zinacef antibiotic in  the mixture.  Skip Mayer assisted throughout and was invaluable to  completion of the case in that she helped position and retract while I  performed the  procedure.  She also closed simultaneously to help  minimize OR time.   DESCRIPTION OF PROCEDURE:  The patient was taken to the operating suite  where general anesthetic was applied without difficulty.  She was  positioned in the lateral decubitus position with the left hip up.  An  axillary roll was placed, and all bony prominences were appropriately  padded.  Hip positioners were utilized.  She was then prepped and draped  in normal sterile fashion.  After administration of IV Kefzol, a  posterior approach was taken to the left hip.  All appropriate anti-  infective measures were used including the preoperative IV antibiotic,  Betadine impregnated drape, and closed hooded exhaust systems for each  member of the surgical team.  An incision was made with dissection down  to the IT band and gluteus maximus fascia.  These structures were  incised longitudinally to expose the short external rotators of the hip  which were tagged and reflected.  The posterior capsule was released.  A  revision femoral neck cut was made with a saw above the lesser  trochanter.  The residual femoral head was removed, and the acetabulum  was sized to a 50 and appeared to be benign in terms of degenerative  changes.  The calcar was inspected, and she did have portion missing  which went down to the lesser trochanter.  The split did not extend  lower than that spot.  We elected to place a cemented stem.  An  introductory reamer was used followed by lateralizer and reaming and  broaching up to a size 4 which seemed to fit best.  A trial reduction  was done with several components, and the -3 standard assembly seemed to  give her good stability in extension with external rotation and flexion  and with internal rotation and also seemed to optimize her leg lengths.  The trial component was removed followed by placement of a size 4 cement  restricter distally.  Thorough irrigation was performed of the femur,  and  cement was mixed including Zinacef.  The cement was pressurized  followed by placement of the size 4 basic Summit cemented component in  appropriate anteversion.  Excess cement was trimmed.  The pressure was  held on the component until the cement had hardened.  We then placed the  -3 taper and the size 50 fracture head unipolar variety.  Again, the hip  was reduced and was stable in the aforementioned positions.  Leg lengths  were judged to be roughly equal.  The wound was irrigated followed by  reapproximation of the short external rotators to the greater  trochanteric region with nonabsorbable suture.  The wound was again  irrigated followed by reapproximation of IT band and gluteus maximus  fascia with #1 Ethibond.  Subcutaneous tissues were reapproximated with  0 and 2-0 undyed Vicryl followed by skin closure with staples.  Adaptic  was applied to the wound followed by dry gauze and tape.  Estimated  blood loss and intraoperative fluids can be obtained from anesthesia  records.   DISPOSITION:  The patient was extubated in the operating room and taken  to the recovery in stable addition.  Plans were for her to be admitted  to the orthopedic surgery service for appropriate postoperative care to  include perioperative antibiotics and Coumadin for DVT prophylaxis.  We  will mobilize her out of bed immediately.      Lubertha Basque Jerl Santos, M.D.  Electronically Signed     PGD/MEDQ  D:  07/13/2006  T:  07/14/2006  Job:  [id number]

## 2010-08-19 NOTE — Consult Note (Signed)
NAME:  [name], [name]                           ACCOUNT NO.:  [id number]   MEDICAL RECORD NO.:  [id number]                   PATIENT TYPE:   LOCATION:                                       FACILITY:  [facility]   PHYSICIAN:  Lionel December, M.D.                 DATE OF BIRTH:  November 15, 1922   DATE OF CONSULTATION:  02/18/2002  DATE OF DISCHARGE:                                   CONSULTATION   PRESENTING COMPLAINT:  Epigastric pain of one year's duration.   HISTORY OF PRESENT ILLNESS:  The patient is a 77 year old Caucasian female  who is referred through the courtesy of Dr. Sherwood Gambler for further evaluation in  epigastric pain that she has had for at least a year.  She gets this pain in  the high midepigastric area, sharp pain.  About six months ago the pain was  quite severe, and she went to the emergency room.  She was begun on Nexium  and given GI cocktail, which has helped.  The pain has never gone away  completely.  She has changed her eating habits, and this seemed to decrease  the intensity and severity of this pain.  She burps a lot, however, she has  never experienced nausea, vomiting, or heartburn.  She also denies  dysphagia.  She has a good appetite.  Last year while her husband was in the  hospital for 4-1/2 months she lost weight down to 120 pounds, but she is now  back up to 138 pounds, which is close towards her baseline.  She denies  melena, rectal bleeding, or change in her bowel habits.  She does wake up at  night with this pain.  She has had this pain previously, but she had  gallbladder surgery about five years ago and did fine for a number of years.  She does not take any NSAIDs on a regular basis.  She may take a couple of  ASA doses a month.   REVIEW OF SYSTEMS:  Negative for fevers, chills, night sweats.   MEDICATIONS:  1. Nexium 40 mg q.d.  2. Zetia 10 mg q.d.  3. GI cocktail p.r.n., which she takes a couple of times a month.  4. Synthroid 50 mcg q.d.  5. MVI q.d.  6. Calcium 1 q.d.   PAST MEDICAL HISTORY:  She has hypothyroidism, hypercholesterolemia.  She  also has head tremors and is receiving botulin toxin every six months, which  seems to have helped her.  She had neck surgery x 2 for disk disease.  She  had cholecystectomy about five years ago, and she has had a hysterectomy.  She had EGD in July 1996, by me for epigastric pain.  She is noted to have a  small sliding hiatal hernia and gastroduodenitis, but her CLOtest was  negative.  She had a sigmoidoscopy and barium enema in May 1993.  She  had  extensive diverticular disease of the sigmoid colon.   ALLERGIES:  None known.   FAMILY HISTORY:  Both parents are deceased.  Mother died of stroke at 69 and  father of CAD at age 80.  One brother is living, and he has cancer of head  and neck.  Two brothers are deceased.  One died of unknown type of cancer in  his 6s, another one of prostate CA at age 48.  One sister died following  CABG.  She apparently had a stroke.   SOCIAL HISTORY:  She is married.  She has two children.  She is retired.  She worked at [employer] for 47 years.  She has never smoked cigarettes and does  not drink alcohol.   PHYSICAL EXAMINATION:  GENERAL:  Pleasant, well-developed, thin Caucasian  female who is in no acute distress.  She  has a tremor to her head.  VITAL SIGNS:  She weighs 138 pounds.  She is 5 feet 6 inches tall.  Pulse 64  per minute, blood pressure 100/78, temperature 96.4.  HEENT:  Conjunctivae pink.  Sclerae nonicteric.  Oropharyngeal mucosa is  normal.  NECK:  No masses or thyromegaly.  Carotids are 2+ bilaterally, without  bruits.  CARDIAC:  Regular rhythm.  Normal S1, S2.  No murmur or gallop noted.  LUNGS:  Clear to auscultation.  ABDOMEN:  Flat, soft.  No bruits noted.  Palpation reveals soft abdomen and  mild midepigastric tenderness.  RECTAL:  Guaiac-negative stool.  EXTREMITIES:  No clubbing or edema noted.   ASSESSMENT:  The patient is a  77 year old Caucasian female with a one-year  history of epigastric pain which is better with PPI and p.r.n. use of GI  cocktail but never has gone for good.  Not mentioned above, she did have a  normal CBC, LFTs, serum calcium, amylase, and lipase on January 21, 2002, by  Dr. Sherwood Gambler.  We could be dealing with dyspepsia.  However, given her age we  need to be sure that she does not have conditions like refractory gastric  ulcer, chronic gastritis, or a large hiatal hernia.  Also need to make sure  she does not have pancreatic disease.   PLAN:  She will continue Nexium as before, and use GI cocktail on a p.r.n.  basis.  Will try her on Carafate liquid 1 g at bedtime, prescription given  for 30 with a refill.  Upper abdominal ultrasound to be followed by  diagnostic esophagogastroduodenoscopy.  I have reviewed the procedures with  the patient, and she is agreeable.  Further recommendations will be made  based on findings of ultrasonography and EGD.   I would like to thank Dr. Sherwood Gambler for the opportunity to participate in the  care of this nice lady.                                               Lionel December, M.D.    NR/MEDQ  D:  02/18/2002  T:  02/19/2002  Job:  [id number]   cc:   Madelin Rear. Sherwood Gambler, M.D.  [address]  [place]  [place] [address]  Fax: [phone]

## 2010-08-19 NOTE — Op Note (Signed)
[name], [name]                 ACCOUNT NO.:  [id number]   MEDICAL RECORD NO.:  [id number]          PATIENT TYPE:  AMB   LOCATION:  DAY                           FACILITY:  [facility]   PHYSICIAN:  Trish Fountain, MD    DATE OF BIRTH:  1922-09-03   DATE OF PROCEDURE:  07/24/2005  DATE OF DISCHARGE:                                 OPERATIVE REPORT   PREOPERATIVE DIAGNOSIS:  Cataract, right eye.   POSTOPERATIVE DIAGNOSIS:  Cataract, right eye.   SURGERY:  Kelman phacoemulsification, right eye, with posterior chamber  intraocular lens, right eye.   ANESTHESIA:  MAC with topical anesthesia of the right eye.   SURGEON:  Trish Fountain, MD   SPECIMENS:  None.   COMPLICATIONS:  None.   LENS MODEL:  AMO model [id number], 20 Diopter lens, serial # [id number].   HISTORY:  This is an 77year-old female with progressive decreased vision in  the right eye.   DESCRIPTION OF PROCEDURE:  In the preoperative area, the patient had  Cyclogyl and Neo-Synephrine drops in the right eye in order to dilate the  eye along with Tetracaine to help anesthetize the eye.  Once the patient's  right eye was dilated, the patient was taken to the operating room and  prepped.  The right eye was prepped and draped in the usual sterile manner.  A lid speculum was placed in the right eye, and 2% Xylocaine jelly was  placed in the right eye as well.  A paracentesis was made through clear  cornea at the limbus at approximately the 11 o'clock position of the right  eye.  Nonpreserved Xylocaine 1% 1 cc was placed into the anterior chamber  for one minute.  Viscoat was then used to fill the anterior chamber.  Using  a 2.75 mm blade at the 9 o'clock position, an incision into the anterior  chamber was made through clear cornea near the limbus.  Viscoat was again  used to reform the anterior chamber.  A 25 gauge bent capsulotomy needle was  used to begin the capsulorrhexis through the anterior capsule of the lens.  Utrata forceps were used to make a 360 degree anterior capsulorrhexis.  A  Chang 27 gauge irrigating cannula was used to hydrodissect and  hydrodelineate the nucleus.  Once hydrodissection and hydrodelineation was  carried out, [facility] phacoemulsification was used to make a deep groove in  the lens nucleus.  The lens was rotated 360 degrees and divided into four  quadrants using deep grooves made by phacoemulsification with the [facility]  phacoemulsification tip.  The nucleus was then divided using the phaco tip  and the nucleus manipulator.  The nuclear quadrants were then removed using  phacoemulsification.  The irrigation aspiration was then used to remove the  remainder of the cortex.  The anterior chamber and posterior capsule was  filled with Provisc, and the 9 o'clock position incision was slightly  widened, using the same 2.75 mm blade that was initially used to make the  incision.  An intraocular lens was placed in the shooter, and this was  placed in the eye, followed by placement of the trailing haptic into the  posterior capsule, using the Kugelan.  Irrigation/aspiration was then used  to remove Provisc from the anterior chamber and the posterior capsule.  BSS  on a syringe was then used to hydrate the cornea at the 9 o'clock incision  site.  The incision site was then checked for water tightness, using a Weck-  cel.  Half-strength Betadine solution was placed, 1 drop, in the inner  canthus, and 1 drop in the outer canthus.  After one minute, this was rinsed  from the eye.  Drops were placed in the eye, Vigamox, followed by Nevanac  followed by Econopred.  A shield was placed over the patient's right eye,  and the patient was sent to the recovery room in satisfactory condition.      Trish Fountain, MD  Electronically Signed     PVK/MEDQ  D:  07/24/2005  T:  07/25/2005  Job:  [id number]

## 2010-08-19 NOTE — Discharge Summary (Signed)
NAMEMATTINGLY, FOUNTAINE                 ACCOUNT NO.:  [id number]   MEDICAL RECORD NO.:  [id number]          PATIENT TYPE:  INP   LOCATION:  5022                         FACILITY:  [facility]   PHYSICIAN:  Lubertha Basque. Dalldorf, M.D.DATE OF BIRTH:  November 25, 1922   DATE OF ADMISSION:  07/13/2006  DATE OF DISCHARGE:  07/17/2006                         DISCHARGE SUMMARY - REFERRING   ADMISSION DIAGNOSES:  1. Left hip femoral neck fracture, displaced.  2. History of hypertension.  3. History of diverticulosis.  4. Hypothyroidism.  5. Hypercholesterolemia.  6. History of right distal radius fracture.   DISCHARGE DIAGNOSES:  1. Left hip femoral neck fracture, displaced.  2. History of hypertension.  3. History of diverticulosis.  4. Hypothyroidism.  5. Hypercholesterolemia.  6. History of right distal radius fracture.  7. Confusion.   BRIEF HISTORY:  This is an 77 year old white female who had fallen and  was unable to stand and bear weight and x-rays were taken in the [place], area and noted to have a femoral neck fracture  displaced, left side.  She was transported to [facility] [facility] where she was admitted to the floor and then later that  evening, taken to the operating room.  We have discussed the risk of  anesthesia, infection, DVT with the family and patient.   PERTINENT LABORATORY AND X-RAY FINDINGS:  CBC with hemoglobin 11.7,  hematocrit 35.7, wbc 11.2.  Chem-7 with sodium 135, potassium 3.4, BUN  7, glucose 139.  Latest INR was 2.1.  Heart CK 310.  Calcium 7.6.   HOSPITAL COURSE:  Patient was admitted to the floor, taken to the  operating room and then was put on a variety of p.o. and IM analgesics  for pain.  IV Ancef 1 g q.8h. x3 doses was used.  She had a Foley  catheter in postoperatively.  Incentive spirometry, knee-high TEDs, was  kept on her home medications which will be described in the end of the  discharge summary.  She could be weightbearing as  tolerated with the  help of physical therapy.  She was also on low dose Coumadin protocol  for DVT prophylaxis.  The first day postoperatively, her vital signs  were stable.  She did have some difficulty on the second day  postoperatively with some decreased blood pressure that was monitored.  It eventually came back to being within normal limits.  Her lungs were  clear.  Her abdomen was soft.  Hip wound on the left side was noted to  be benign.  Dressing changed during her hospital stay.  Due to the fact  she was living alone prefracture, we did not feel it was safe for her to  go home alone and she was evaluated for skilled nursing facility care  which she was being discharged to.   CONDITION ON DISCHARGE:  Improved.   FOLLOW UP:  She will remain on Coumadin for a total of 4 weeks.  Her  diet would be low sodium, heart healthy.  May change the dressing on her  hip daily, paint it with Betadine.  May be weightbearing as tolerated  with crutches or walker and the help of physical therapy.  If any sign  of infection, to call our office immediately, [phone].  We will follow  her up in our office in one month.  Her staples could be removed at the  two-week mark from surgery.   DISCHARGE MEDICATIONS:  She will be kept on her home medications which  were  1. Synthroid 50 mcg one daily.  2. Cipro 500 mg one p.o. b.i.d.  3. Clonazepam 0.5 half of a pill p.o. b.i.d.  4. Flagyl 500 mg one p.o. b.i.d.  Both the Flagyl and Cipro will have to be stopped by her primary care  physician at the appropriate time.      Lindwood Qua, P.A.      Lubertha Basque Jerl Santos, M.D.  Electronically Signed    MC/MEDQ  D:  07/17/2006  T:  07/17/2006  Job:  [id number]

## 2010-08-19 NOTE — Op Note (Signed)
NAME:  [name], [name]                           ACCOUNT NO.:  [id number]   MEDICAL RECORD NO.:  [id number]                   PATIENT TYPE:  AMB   LOCATION:  DAY                                  FACILITY:  [facility]   PHYSICIAN:  Lionel December, M.D.                 DATE OF BIRTH:  1922-06-28   DATE OF PROCEDURE:  02/28/2002  DATE OF DISCHARGE:                                 OPERATIVE REPORT   PROCEDURE:  Esophagogastroduodenoscopy.   ENDOSCOPIST:  Lionel December, M.D.   INDICATIONS:  This patient is a 77 year old Caucasian female with recurrent  epigastric pain that she has had for over a year.  She has noted some relief  with Nexium, but the pain has never gone for good.  She had an ultrasound  earlier this morning.  Her bile duct was 8 mm and intrahepatic ducts were  prominent, but there was no mass in the head of the pancreas or suggestion  of choledocholithiasis.  A __________ of 8 mm would not be unusual in a  patient who has had cholecystectomy.  She is undergoing diagnostic EGD  looking for the source of her symptoms.  The procedure and risks were  reviewed with the patient and informed consent was obtained.   PREOPERATIVE MEDICATIONS:  Cetacaine spray for pharyngeal topical  anesthesia, Demerol 25 mg IV and Versed 2 mg IV in divided dose.   INSTRUMENT:  Olympus video system.   FINDINGS:  Procedure performed in endoscopy suite.  The patient's vital  signs and O2 saturation were monitored during the procedure and remained  stable.  The patient was placed in the left lateral recumbent position and  endoscope was passed via the oropharynx without any difficulty into the  esophagus.   ESOPHAGUS:  Mucosa of the esophagus was normal throughout.  She had 2 tiny  erosions at the GE junction and a small sliding hiatal hernia.  There was no  ring or stricture noted.   STOMACH:  It was empty and distended very well with insufflation.  The folds  of the proximal stomach were normal.   Examination of the mucosa revealed  linear streaks of erythema at the antrum converging onto the pylorus.  However, there are no erosions or ulcers.  The scope was retroflexed to exam  the angularis which was unremarkable.  There were a few small polyps at the  gastric fundus.  The largest one was ablated by a cold biopsy along with 2  more.   DUODENUM:  Examination of the bulb and second part of the duodenum was  normal.   The endoscope was withdrawn.  The patient tolerated the procedure well.   FINAL DIAGNOSES:  1. Erosive reflux esophagitis with small sliding hiatal hernia.  2. Nonerosive antral gastritis.  3. Small gastric polyps at fundus that were ablated by cold biopsy.   RECOMMENDATIONS:  1. LFTs will be checked  today along with H. pylori serology. She will     continue therapy.  The need for an abdominal CT to further look at her     biliary system will be determined after lab studies are available for     review.                                               Lionel December, M.D.    NR/MEDQ  D:  02/28/2002  T:  02/28/2002  Job:  [id number]   cc:   Madelin Rear. Sherwood Gambler, M.D.  [address]  [place]  [place] [address]  Fax: [phone]

## 2010-08-19 NOTE — Op Note (Signed)
   NAME:  [name], [name]                           ACCOUNT NO.:  [id number]   MEDICAL RECORD NO.:  [id number]                   PATIENT TYPE:  AMB   LOCATION:  DAY                                  FACILITY:  [facility]   PHYSICIAN:  Lionel December, M.D.                 DATE OF BIRTH:  Dec 31, 1922   DATE OF PROCEDURE:  11/25/2002  DATE OF DISCHARGE:                                 OPERATIVE REPORT   PROCEDURE:  Total colonoscopy.   INDICATIONS FOR PROCEDURE:  [name] is a 77 year old Caucasian female who  is undergoing screening colonoscopy.  She has had intermittent pain in the  right lower quadrant felt to be IBS.  The procedure was reviewed with the  patient, and informed consent was obtained.   PREOPERATIVE MEDICATIONS:  Demerol 25 mg IV, Versed 4 mg IV in divided  doses.   FINDINGS:  The procedure was performed in the endoscopy suite.  The  patient's vital signs and O2 saturations were monitored during the procedure  and remained stable.  The patient was placed in the left lateral recumbent  position and rectal examination performed.  No abnormality noted on external  or digital exam.  The Olympus videoscope was placed into the rectum and  advanced into the region of the sigmoid colon which had scattered  diverticula, with a few more at the descending colon.  The preparation on a  whole was satisfactory.  She did have some formed stool here and there.  The  scope was passed to the cecum which was identified by the appendiceal  orifice and ileocecal valve.  Pictures were taken for the record.  As the  scope was withdrawn, the colonic mucosa was once again carefully examined.  There were no polyps and/or tumor masses.  The rectal mucosa was normal.  The scope was retroflexed to examine the anorectal junction, and hemorrhoids  were noted below the dentate line.  The endoscope was straightened and  withdrawn.  The patient tolerated the procedure well.   FINAL DIAGNOSIS:  Left colonic  diverticulosis and external hemorrhoids.  Otherwise normal colonoscopy.   RECOMMENDATIONS:  She should continue yearly Hemoccults and high fiber diet.                                               Lionel December, M.D.    NR/MEDQ  D:  11/25/2002  T:  11/25/2002  Job:  [id number]   cc:   Madelin Rear. Sherwood Gambler, M.D.  [address]  [place]  [place] [address]  Fax: [phone]

## 2011-01-30 ENCOUNTER — Other Ambulatory Visit (HOSPITAL_COMMUNITY): Payer: Self-pay | Admitting: Family Medicine

## 2011-01-30 DIAGNOSIS — Z139 Encounter for screening, unspecified: Secondary | ICD-10-CM

## 2011-02-09 ENCOUNTER — Emergency Department (HOSPITAL_COMMUNITY)
Admission: EM | Admit: 2011-02-09 | Discharge: 2011-02-09 | Disposition: A | Discharge status: Home / Self Care | Payer: Medicare Other | Attending: Emergency Medicine | Admitting: Emergency Medicine

## 2011-02-09 ENCOUNTER — Emergency Department (HOSPITAL_COMMUNITY): Payer: Medicare Other

## 2011-02-09 ENCOUNTER — Encounter: Payer: Self-pay | Admitting: *Deleted

## 2011-02-09 DIAGNOSIS — R002 Palpitations: Secondary | ICD-10-CM | POA: Insufficient documentation

## 2011-02-09 DIAGNOSIS — R188 Other ascites: Secondary | ICD-10-CM | POA: Insufficient documentation

## 2011-02-09 DIAGNOSIS — R0789 Other chest pain: Secondary | ICD-10-CM

## 2011-02-09 DIAGNOSIS — R071 Chest pain on breathing: Secondary | ICD-10-CM | POA: Insufficient documentation

## 2011-02-09 DIAGNOSIS — R1032 Left lower quadrant pain: Secondary | ICD-10-CM | POA: Insufficient documentation

## 2011-02-09 LAB — BASIC METABOLIC PANEL
BUN: 15 mg/dL (ref 6–23)
CO2: 30 mEq/L (ref 19–32)
Calcium: 9.5 mg/dL (ref 8.4–10.5)
Creatinine, Ser: 1.1 mg/dL (ref 0.50–1.10)
GFR calc non Af Amer: 44 mL/min — ABNORMAL LOW (ref >90)
Glucose, Bld: 118 mg/dL — ABNORMAL HIGH (ref 70–99)
Sodium: 137 mEq/L (ref 135–145)

## 2011-02-09 LAB — CBC
HCT: 48.4 % — ABNORMAL HIGH (ref 36.0–46.0)
MCH: 29.2 pg (ref 26.0–34.0)
MCV: 90.1 fL (ref 78.0–100.0)
Platelets: 180 10*3/uL (ref 150–400)
RBC: 5.37 MIL/uL — ABNORMAL HIGH (ref 3.87–5.11)
RDW: 14.3 % (ref 11.5–15.5)

## 2011-02-09 LAB — D-DIMER, QUANTITATIVE: D-Dimer, Quant: 1.37 ug/mL-FEU — ABNORMAL HIGH (ref 0.00–0.48)

## 2011-02-09 LAB — DIFFERENTIAL
Eosinophils Absolute: 0 10*3/uL (ref 0.0–0.7)
Eosinophils Relative: 1 % (ref 0–5)
Lymphs Abs: 1.8 10*3/uL (ref 0.7–4.0)
Monocytes Absolute: 0.7 10*3/uL (ref 0.1–1.0)

## 2011-02-09 MED ORDER — IBUPROFEN 800 MG PO TABS
800.0000 mg | ORAL_TABLET | Freq: Once | ORAL | Status: AC
  Administered 2011-02-09: 800 mg via ORAL
  Filled 2011-02-09: qty 1

## 2011-02-09 MED ORDER — HYDROCODONE-ACETAMINOPHEN 5-325 MG PO TABS
1.0000 | ORAL_TABLET | Freq: Once | ORAL | Status: AC
  Administered 2011-02-09: 1 via ORAL
  Filled 2011-02-09: qty 1

## 2011-02-09 MED ORDER — IOHEXOL 350 MG/ML SOLN
100.0000 mL | Freq: Once | INTRAVENOUS | Status: AC | PRN
  Administered 2011-02-09: 100 mL via INTRAVENOUS

## 2011-02-09 MED ORDER — HYDROCODONE-ACETAMINOPHEN 5-325 MG PO TABS: ORAL_TABLET | ORAL | Status: DC

## 2011-02-09 NOTE — ED Notes (Signed)
Dr. Colon Branch aware of pt requesting pain medication

## 2011-02-09 NOTE — ED Provider Notes (Signed)
History   This chart was scribed for [employer]. Colon Branch, MD by Clarita Crane. The patient was seen in room APA11/APA11 and the patient's care was started at 1:35PM.   CSN: [id number] Arrival date & time: 02/09/2011 11:49 AM   First MD Initiated Contact with Patient 02/09/11 1249      Chief Complaint  Patient presents with  . Flank Pain    (Consider location/radiation/quality/duration/timing/severity/associated sxs/prior treatment) HPI [name] is a 77 y.o. female who presents to the Emergency Department complaining of constant, moderate to severe, non-radiating left flank pain onset 1 week ago and gradually worsening since but significantly worse this morning with mild cough which is intermittently productive. Patient reports pain is relieved mildly with Tylenol and aggravated by palpation. Denies previous history of similar symptoms. Denies fall, fever, chills, nausea, vomiting.   PCP- Dr. Phillips Odor  No past medical history on file.  No past surgical history on file.  No family history on file.  History  Substance Use Topics  . Smoking status: Never Smoker   . Smokeless tobacco: Not on file  . Alcohol Use: No    OB History    Grav Para Term Preterm Abortions TAB SAB Ect Mult Living                  Review of Systems 10 Systems reviewed and are negative for acute change except as noted in the HPI.  Allergies  Review of patient's allergies indicates no known allergies.  Home Medications   Current Outpatient Rx  Name Route Sig Dispense Refill  . BENAZEPRIL HCL 10 MG PO TABS Oral Take 10 mg by mouth at bedtime.      Marland Kitchen CALCIUM CARBONATE-VITAMIN D 500-200 MG-UNIT PO TABS Oral Take 1 tablet by mouth daily.      Marland Kitchen CLONAZEPAM 0.5 MG PO TABS Oral Take 0.5 mg by mouth 2 (two) times daily as needed. anxiety     . DONEPEZIL HCL 5 MG PO TABS Oral Take 5 mg by mouth at bedtime as needed. di [employer]      . LEVOTHYROXINE SODIUM 50 MCG PO TABS Oral Take 50 mcg by mouth daily.        Marland Kitchen MEMANTINE HCL 5 MG PO TABS Oral Take 5 mg by mouth daily.      . CENTRUM PO CHEW Oral Chew 1 tablet by mouth daily.      Marland Kitchen PANTOPRAZOLE SODIUM 40 MG PO TBEC Oral Take 40 mg by mouth daily.        BP 125/76  Pulse 73  Temp(Src) 98 F (36.7 C) (Oral)  Resp 14  Ht 5\' 6"  (1.676 m)  Wt 120 lb (54.432 kg)  BMI 19.37 kg/m2  SpO2 94%  Physical Exam  Nursing note and vitals reviewed. Constitutional: She is oriented to person, place, and time. She appears well-developed and well-nourished. No distress.  HENT:  Head: Normocephalic and atraumatic.  Eyes: EOM are normal. Pupils are equal, round, and reactive to light.  Neck: Neck supple. No tracheal deviation present.  Cardiovascular: Normal rate and regular rhythm.  Exam reveals no friction rub.   No murmur heard. Pulmonary/Chest: Effort normal and breath sounds normal. No respiratory distress. She has no wheezes.  Abdominal: She exhibits no distension.       Severe tenderness to left flank.   Musculoskeletal: Normal range of motion. She exhibits no edema.  Neurological: She is alert and oriented to person, place, and time. No sensory deficit.  Skin: Skin  is warm and dry. No rash noted.  Psychiatric: She has a normal mood and affect. Her behavior is normal.    ED Course  Procedures (including critical care time)  DIAGNOSTIC STUDIES: Oxygen Saturation is 95% on room air, adequate by my interpretation.    COORDINATION OF CARE:    Labs Reviewed  CBC - Abnormal; Notable for the following:    RBC 5.37 (*)    Hemoglobin 15.7 (*)    HCT 48.4 (*)    All other components within normal limits  BASIC METABOLIC PANEL - Abnormal; Notable for the following:    Glucose, Bld 118 (*)    GFR calc non Af Amer 44 (*)    GFR calc Af Amer 51 (*)    All other components within normal limits  D-DIMER, QUANTITATIVE - Abnormal; Notable for the following:    D-Dimer, Quant 1.37 (*)    All other components within normal limits  DIFFERENTIAL   URINALYSIS, ROUTINE W REFLEX MICROSCOPIC   Dg Chest 2 View  02/09/2011  *RADIOLOGY REPORT*  Clinical Data: Left side rib pain  CHEST - 2 VIEW  Comparison: 11/12/2008  Findings: Upper-normal size of cardiac silhouette. Calcified tortuous aorta. Pulmonary vascularity normal. Emphysematous and bronchitic changes. No pulmonary infiltrate, pleural effusion, or pneumothorax. Diffuse osseous demineralization.  IMPRESSION: Emphysematous and chronic bronchitic changes. No acute abnormalities.  Original Report Authenticated By: Lollie Marrow, M.D.     No diagnosis found.    MDM  Patient with left sided chest wall pain reproducible with palpation. D-dimer positive, CT negative for PE. Remainder of labs unremarkable. Analgesics given with good relief of pain. Patient ambulated in the department without pain.Pt feels improved after observation and/or treatment in ED.Pt stable in ED with no significant deterioration in condition.The patient appears reasonably screened and/or stabilized for discharge and I doubt any other medical condition or other [facility] requiring further screening, evaluation, or treatment in the ED at this time prior to discharge.  MDM Reviewed: nursing note and vitals Interpretation: labs, x-ray and CT scan    I personally performed the services described in this documentation, which was scribed in my presence. The recorded information has been reviewed and considered.   Nicoletta Dress. Colon Branch, MD 02/09/11 [phone]

## 2011-02-09 NOTE — ED Notes (Signed)
Pt c/o left flank pain, starting a week ago however pain increased in past 24 hrs. Pt also reports urinary frequency. Denies fever/chills.

## 2011-03-06 ENCOUNTER — Ambulatory Visit (HOSPITAL_COMMUNITY): Payer: Medicare Other

## 2011-03-16 ENCOUNTER — Ambulatory Visit (HOSPITAL_COMMUNITY)
Admission: RE | Admit: 2011-03-16 | Discharge: 2011-03-16 | Disposition: A | Discharge status: Home / Self Care | Payer: Medicare Other | Source: Ambulatory Visit | Attending: Family Medicine | Admitting: Family Medicine

## 2011-03-16 DIAGNOSIS — Z139 Encounter for screening, unspecified: Secondary | ICD-10-CM

## 2011-03-16 DIAGNOSIS — Z1231 Encounter for screening mammogram for malignant neoplasm of breast: Secondary | ICD-10-CM | POA: Insufficient documentation

## 2012-01-04 ENCOUNTER — Emergency Department (HOSPITAL_COMMUNITY)
Admission: EM | Admit: 2012-01-04 | Discharge: 2012-01-04 | Disposition: A | Discharge status: Home / Self Care | Payer: Medicare Other | Attending: Emergency Medicine | Admitting: Emergency Medicine

## 2012-01-04 ENCOUNTER — Encounter (HOSPITAL_COMMUNITY): Payer: Self-pay | Admitting: *Deleted

## 2012-01-04 DIAGNOSIS — W19XXXA Unspecified fall, initial encounter: Secondary | ICD-10-CM | POA: Insufficient documentation

## 2012-01-04 DIAGNOSIS — Y9210 Unspecified residential institution as the place of occurrence of the external cause: Secondary | ICD-10-CM | POA: Insufficient documentation

## 2012-01-04 DIAGNOSIS — Z049 Encounter for examination and observation for unspecified reason: Secondary | ICD-10-CM | POA: Insufficient documentation

## 2012-01-04 DIAGNOSIS — F0390 Unspecified dementia without behavioral disturbance: Secondary | ICD-10-CM | POA: Insufficient documentation

## 2012-01-04 NOTE — ED Notes (Signed)
Received pt via EMS secondary to a fall after her storm door malfunctioned and pt was unable to get in house,per EMS. Pt denies injury, requested transport to hospital "to be checked out".

## 2012-01-04 NOTE — ED Provider Notes (Signed)
History   This chart was scribed for Hurman Horn, MD by Gerlean Ren. This patient was seen in room APA05/APA05 and the patient's care was started at 10:22PM.   CSN: [id number]  Arrival date & time 01/04/12  2124   First MD Initiated Contact with Patient 01/04/12 2219      Chief Complaint  Patient presents with  . Fall    (Consider location/radiation/quality/duration/timing/severity/associated sxs/prior treatment) The history is provided by the patient. No language interpreter was used.   [name] is a 78 y.o. female brought in by ambulance who presents to the Emergency Department per family pt was found laying on floor of patio outside with no signs of blood or apparent trauma indicative of a fall.  Pt denies any current pain.  Pt denies fever, neck pain, sore throat, visual disturbance, CP, cough, SOB, abdominal pain, nausea, emesis, diarrhea, urinary symptoms, back pain, HA, weakness, numbness and rash as associated symptoms.  Pt denies tobacco and alcohol use.     History reviewed. No pertinent past medical history.  History reviewed. No pertinent past surgical history.  No family history on file.  History  Substance Use Topics  . Smoking status: Never Smoker   . Smokeless tobacco: Not on file  . Alcohol Use: No    No OB history provided.  Review of Systems 10 Systems reviewed and are negative for acute change except as noted in the HPI.  Allergies  Review of patient's allergies indicates no known allergies.  Home Medications   Current Outpatient Rx  Name Route Sig Dispense Refill  . ASPIRIN EC 81 MG PO TBEC Oral Take 81 mg by mouth every evening.    . DONEPEZIL HCL 5 MG PO TABS Oral Take 5 mg by mouth at bedtime. di [employer]     . BENAZEPRIL HCL 10 MG PO TABS Oral Take 10 mg by mouth at bedtime.      Marland Kitchen CALCIUM CARBONATE-VITAMIN D 500-200 MG-UNIT PO TABS Oral Take 1 tablet by mouth daily.      Marland Kitchen HYDROCODONE-ACETAMINOPHEN 5-325 MG PO TABS  Take 1/2 to 1 pill  every 4 hours as needed for pain. 15 tablet 0  . LEVOTHYROXINE SODIUM 50 MCG PO TABS Oral Take 50 mcg by mouth daily.      Marland Kitchen MEMANTINE HCL 5 MG PO TABS Oral Take 5 mg by mouth daily.      . CENTRUM PO CHEW Oral Chew 1 tablet by mouth daily.      Marland Kitchen PANTOPRAZOLE SODIUM 40 MG PO TBEC Oral Take 40 mg by mouth daily.        BP 138/77  Pulse 79  Temp 98 F (36.7 C) (Oral)  Resp 22  Ht 5\' 6"  (1.676 m)  Wt 124 lb (56.246 kg)  BMI 20.01 kg/m2  SpO2 97%  Physical Exam  Nursing note and vitals reviewed. Constitutional:       Awake, alert, nontoxic appearance with baseline speech for patient.  HENT:  Head: Atraumatic.  Mouth/Throat: No oropharyngeal exudate.  Eyes: EOM are normal. Pupils are equal, round, and reactive to light. Right eye exhibits no discharge. Left eye exhibits no discharge.  Neck: Neck supple.  Cardiovascular: Normal rate and regular rhythm.   No murmur heard. Pulmonary/Chest: Effort normal and breath sounds normal. No stridor. No respiratory distress. She has no wheezes. She has no rales. She exhibits no tenderness.  Abdominal: Soft. Bowel sounds are normal. She exhibits no mass. There is no tenderness. There is  no rebound.  Musculoskeletal: She exhibits no tenderness.       Baseline ROM, moves extremities with no obvious new focal weakness.  Lymphadenopathy:    She has no cervical adenopathy.  Neurological:       Awake, alert, cooperative and aware of situation; motor strength bilaterally; sensation normal to light touch bilaterally; peripheral visual fields full to confrontation; no facial asymmetry; tongue midline; major cranial nerves appear intact; no pronator drift, normal finger to nose bilaterally, baseline gait without new ataxia.   Baseline pleasant dementia.  Skin: No rash noted.  Psychiatric: She has a normal mood and affect.    ED Course  Procedures (including critical care time) DIAGNOSTIC STUDIES: Oxygen Saturation is 97% on room air, adequate by my  interpretation.    COORDINATION OF CARE: 10:28PM- Patient and family informed of clinical course, understand medical decision-making process, and agree with plan.  Imaging and testing offered, son states no apparent injury and no need for further imaging or testing.      Labs Reviewed - No data to display No results found.   1. Dementia       MDM  Pt stable in ED with no significant deterioration in condition.  Patient / Family / Caregiver informed of clinical course, understand medical decision-making process, and agree with plan.  I doubt any other EMC precluding discharge at this time including, but not necessarily limited to the following:TBI.  I personally performed the services described in this documentation, which was scribed in my presence. The recorded information has been reviewed and considered.          Hurman Horn, MD 01/06/12 [id number]

## 2012-01-04 NOTE — ED Notes (Signed)
Pt was locked out of her house due to a storm door being jammed and pt was unable to get door open, neighbor found pt when she went to check on her. Pt states went out just before dark. Neighbor feels pt was out there approximately 2 hrs. Pt states had just fallen before she was found. Pt denies injury at this time. Pt is alert and oriented x 4. Pt however is very hard of hearing.

## 2012-02-05 ENCOUNTER — Other Ambulatory Visit (HOSPITAL_COMMUNITY): Payer: Self-pay | Admitting: Family Medicine

## 2012-02-05 DIAGNOSIS — Z139 Encounter for screening, unspecified: Secondary | ICD-10-CM

## 2012-03-18 ENCOUNTER — Ambulatory Visit (HOSPITAL_COMMUNITY)
Admission: RE | Admit: 2012-03-18 | Discharge: 2012-03-18 | Disposition: A | Discharge status: Home / Self Care | Payer: Medicare Other | Source: Ambulatory Visit | Attending: Family Medicine | Admitting: Family Medicine

## 2012-03-18 DIAGNOSIS — Z1231 Encounter for screening mammogram for malignant neoplasm of breast: Secondary | ICD-10-CM | POA: Insufficient documentation

## 2012-03-18 DIAGNOSIS — Z139 Encounter for screening, unspecified: Secondary | ICD-10-CM

## 2012-04-03 DIAGNOSIS — I2699 Other pulmonary embolism without acute cor pulmonale: Secondary | ICD-10-CM

## 2012-04-03 HISTORY — DX: Other pulmonary embolism without acute cor pulmonale: I26.99

## 2012-04-15 ENCOUNTER — Encounter (HOSPITAL_COMMUNITY): Payer: Self-pay | Admitting: *Deleted

## 2012-04-15 ENCOUNTER — Emergency Department (HOSPITAL_COMMUNITY)
Admission: EM | Admit: 2012-04-15 | Discharge: 2012-04-15 | Disposition: A | Discharge status: Home / Self Care | Payer: Medicare Other | Attending: Emergency Medicine | Admitting: Emergency Medicine

## 2012-04-15 ENCOUNTER — Emergency Department (HOSPITAL_COMMUNITY): Payer: Medicare Other

## 2012-04-15 DIAGNOSIS — J209 Acute bronchitis, unspecified: Secondary | ICD-10-CM | POA: Insufficient documentation

## 2012-04-15 DIAGNOSIS — F0390 Unspecified dementia without behavioral disturbance: Secondary | ICD-10-CM | POA: Insufficient documentation

## 2012-04-15 DIAGNOSIS — R0602 Shortness of breath: Secondary | ICD-10-CM | POA: Insufficient documentation

## 2012-04-15 DIAGNOSIS — Z79899 Other long term (current) drug therapy: Secondary | ICD-10-CM | POA: Insufficient documentation

## 2012-04-15 DIAGNOSIS — Z8719 Personal history of other diseases of the digestive system: Secondary | ICD-10-CM | POA: Insufficient documentation

## 2012-04-15 DIAGNOSIS — Z7982 Long term (current) use of aspirin: Secondary | ICD-10-CM | POA: Insufficient documentation

## 2012-04-15 DIAGNOSIS — J40 Bronchitis, not specified as acute or chronic: Secondary | ICD-10-CM

## 2012-04-15 HISTORY — DX: Diaphragmatic hernia without obstruction or gangrene: K44.9

## 2012-04-15 HISTORY — DX: Unspecified dementia, unspecified severity, without behavioral disturbance, psychotic disturbance, mood disturbance, and anxiety: F03.90

## 2012-04-15 LAB — CBC WITH DIFFERENTIAL/PLATELET
Basophils Absolute: 0 10*3/uL (ref 0.0–0.1)
Basophils Relative: 0 % (ref 0–1)
Eosinophils Absolute: 0 10*3/uL (ref 0.0–0.7)
Eosinophils Relative: 0 % (ref 0–5)
Lymphocytes Relative: 23 % (ref 12–46)
MCHC: 32.7 g/dL (ref 30.0–36.0)
MCV: 89.3 fL (ref 78.0–100.0)
Platelets: 194 10*3/uL (ref 150–400)
RDW: 14 % (ref 11.5–15.5)
WBC: 6.7 10*3/uL (ref 4.0–10.5)

## 2012-04-15 LAB — BASIC METABOLIC PANEL
CO2: 28 mEq/L (ref 19–32)
Calcium: 9.9 mg/dL (ref 8.4–10.5)
Creatinine, Ser: 1.01 mg/dL (ref 0.50–1.10)
GFR calc Af Amer: 56 mL/min — ABNORMAL LOW (ref >90)
GFR calc non Af Amer: 49 mL/min — ABNORMAL LOW (ref >90)
Sodium: 141 mEq/L (ref 135–145)

## 2012-04-15 MED ORDER — AZITHROMYCIN 250 MG PO TABS
500.0000 mg | ORAL_TABLET | Freq: Once | ORAL | Status: AC
  Administered 2012-04-15: 500 mg via ORAL
  Filled 2012-04-15: qty 2

## 2012-04-15 MED ORDER — SODIUM CHLORIDE 0.9 % IV SOLN
INTRAVENOUS | Status: DC
  Administered 2012-04-15: 14:00:00 via INTRAVENOUS

## 2012-04-15 MED ORDER — AZITHROMYCIN 250 MG PO TABS: ORAL_TABLET | ORAL | Status: DC

## 2012-04-15 NOTE — ED Provider Notes (Signed)
History  This chart was scribed for Tiffany Hutching, MD by Ardeen Jourdain, ED Scribe. This patient was seen in room APA05/APA05 and the patient's care was started at 1307.  CSN: [id number]  Arrival date & time 04/15/12  1133   First MD Initiated Contact with Patient 04/15/12 1307      Chief Complaint  Patient presents with  . Abdominal Pain     The history is provided by the patient. No language interpreter was used.   Level 5 Caveat: Dementia   [name] is a 79 y.o. female who presents to the Emergency Department complaining of SOB, unknown starting time.  Apparently she had abdominal pain earlier but none now. No fever, sweats, chills, chest pain, neuro deficits. Nothing makes symptoms better or worse. Severity is mild.  no radiation.  Past Medical History  Diagnosis Date  . Hiatal hernia   . Dementia     History reviewed. No pertinent past surgical history.  No family history on file.  History  Substance Use Topics  . Smoking status: Never Smoker   . Smokeless tobacco: Not on file  . Alcohol Use: No   No OB history available.   Review of Systems  Unable to perform ROS: Dementia  Respiratory: Positive for shortness of breath.   Cardiovascular: Negative for chest pain.  Gastrointestinal: Positive for abdominal pain.   A complete 10 system review of systems was obtained and all systems are negative except as noted in the HPI and PMH.    Allergies  Review of patient's allergies indicates no known allergies.  Home Medications   Current Outpatient Rx  Name  Route  Sig  Dispense  Refill  . ASPIRIN EC 81 MG PO TBEC   Oral   Take 81 mg by mouth every evening.         Marland Kitchen BENAZEPRIL HCL 10 MG PO TABS   Oral   Take 10 mg by mouth at bedtime.           Marland Kitchen CALCIUM CARBONATE-VITAMIN D 500-200 MG-UNIT PO TABS   Oral   Take 1 tablet by mouth daily.           . DONEPEZIL HCL 5 MG PO TABS   Oral   Take 5 mg by mouth at bedtime. di [employer]          .  HYDROCODONE-ACETAMINOPHEN 5-325 MG PO TABS      Take 1/2 to 1 pill every 4 hours as needed for pain.   15 tablet   0   . LEVOTHYROXINE SODIUM 50 MCG PO TABS   Oral   Take 50 mcg by mouth daily.           Marland Kitchen MEMANTINE HCL 5 MG PO TABS   Oral   Take 5 mg by mouth daily.           . CENTRUM PO CHEW   Oral   Chew 1 tablet by mouth daily.           Marland Kitchen PANTOPRAZOLE SODIUM 40 MG PO TBEC   Oral   Take 40 mg by mouth daily.             Triage Vitals: BP 145/96  Pulse 75  Temp 97.4 F (36.3 C) (Oral)  Resp 22  Ht 5\' 5"  (1.651 m)  Wt 125 lb (56.7 kg)  BMI 20.80 kg/m2  SpO2 96%  Physical Exam  Nursing note and vitals reviewed. Constitutional: She is oriented to person,  place, and time. She appears well-developed and well-nourished.  HENT:  Head: Normocephalic and atraumatic.  Eyes: Conjunctivae normal and EOM are normal. Pupils are equal, round, and reactive to light.  Neck: Normal range of motion. Neck supple.  Cardiovascular: Normal rate, regular rhythm and normal heart sounds.   Pulmonary/Chest: Effort normal and breath sounds normal.  Abdominal: Soft. Bowel sounds are normal.  Musculoskeletal: Normal range of motion.  Neurological: She is alert and oriented to person, place, and time.  Skin: Skin is warm and dry.  Psychiatric: She has a normal mood and affect.    ED Course  Procedures (including critical care time)  DIAGNOSTIC STUDIES: Oxygen Saturation is 97% on room air, adequate by my interpretation.    COORDINATION OF CARE:  1:19 PM: Discussed treatment plan which includes an EKG and CXR with pt at bedside and pt agreed to plan.     Labs Reviewed  CBC WITH DIFFERENTIAL - Abnormal; Notable for the following:    RBC 5.41 (*)     Hemoglobin 15.8 (*)     HCT 48.3 (*)     All other components within normal limits  BASIC METABOLIC PANEL - Abnormal; Notable for the following:    GFR calc non Af Amer 49 (*)     GFR calc Af Amer 56 (*)     All other  components within normal limits    Dg Chest Portable 1 View  04/15/2012  *RADIOLOGY REPORT*  Clinical Data: Chest pain.  PORTABLE CHEST - 1 VIEW  Comparison: CT chest and chest radiograph 02/09/2011.  Findings: Trachea is midline.  Heart size stable.  Lungs are low in volume with mild bibasilar air space disease.  Possible small left pleural effusion.  IMPRESSION: Low lung volumes with mild bibasilar air space disease and possible small left pleural effusion.   Original Report Authenticated By: Leanna Battles, M.D.    No results found.   No diagnosis found.   Date: 04/15/2012  Rate: 77  Rhythm: normal sinus rhythm  QRS Axis: normal  Intervals: normal  ST/T Wave abnormalities: normal  Conduction Disutrbances: none  Narrative Interpretation: unremarkable     MDM  No respiratory distress. Pulse ox 97%. Good color.  We'll start Zithromax secondary to age      I personally performed the services described in this documentation, which was scribed in my presence. The recorded information has been reviewed and is accurate.    Tiffany Hutching, MD 04/15/12 1725

## 2012-04-15 NOTE — ED Notes (Signed)
Pt states upper abdominal pain began this morning and hurts to take a deep breath. Caregiver states hx of hiatal hernia.

## 2012-04-19 ENCOUNTER — Emergency Department (HOSPITAL_COMMUNITY): Payer: Medicare Other

## 2012-04-19 ENCOUNTER — Emergency Department (HOSPITAL_COMMUNITY)
Admission: EM | Admit: 2012-04-19 | Discharge: 2012-04-19 | Disposition: A | Discharge status: Home / Self Care | Payer: Medicare Other | Attending: Emergency Medicine | Admitting: Emergency Medicine

## 2012-04-19 ENCOUNTER — Encounter (HOSPITAL_COMMUNITY): Payer: Self-pay | Admitting: Emergency Medicine

## 2012-04-19 DIAGNOSIS — F03918 Unspecified dementia, unspecified severity, with other behavioral disturbance: Secondary | ICD-10-CM | POA: Insufficient documentation

## 2012-04-19 DIAGNOSIS — Z7982 Long term (current) use of aspirin: Secondary | ICD-10-CM | POA: Insufficient documentation

## 2012-04-19 DIAGNOSIS — E079 Disorder of thyroid, unspecified: Secondary | ICD-10-CM | POA: Insufficient documentation

## 2012-04-19 DIAGNOSIS — F0391 Unspecified dementia with behavioral disturbance: Secondary | ICD-10-CM | POA: Insufficient documentation

## 2012-04-19 DIAGNOSIS — R1013 Epigastric pain: Secondary | ICD-10-CM | POA: Insufficient documentation

## 2012-04-19 DIAGNOSIS — I2699 Other pulmonary embolism without acute cor pulmonale: Secondary | ICD-10-CM | POA: Insufficient documentation

## 2012-04-19 DIAGNOSIS — Z79899 Other long term (current) drug therapy: Secondary | ICD-10-CM | POA: Insufficient documentation

## 2012-04-19 DIAGNOSIS — K449 Diaphragmatic hernia without obstruction or gangrene: Secondary | ICD-10-CM | POA: Insufficient documentation

## 2012-04-19 DIAGNOSIS — K219 Gastro-esophageal reflux disease without esophagitis: Secondary | ICD-10-CM | POA: Insufficient documentation

## 2012-04-19 HISTORY — DX: Disorder of thyroid, unspecified: E07.9

## 2012-04-19 HISTORY — DX: Gastro-esophageal reflux disease without esophagitis: K21.9

## 2012-04-19 LAB — CBC WITH DIFFERENTIAL/PLATELET
Hemoglobin: 15.4 g/dL — ABNORMAL HIGH (ref 12.0–15.0)
Lymphs Abs: 2.3 10*3/uL (ref 0.7–4.0)
MCH: 29.3 pg (ref 26.0–34.0)
Monocytes Relative: 6 % (ref 3–12)
Neutro Abs: 4.1 10*3/uL (ref 1.7–7.7)
Neutrophils Relative %: 60 % (ref 43–77)
RBC: 5.26 MIL/uL — ABNORMAL HIGH (ref 3.87–5.11)

## 2012-04-19 LAB — D-DIMER, QUANTITATIVE: D-Dimer, Quant: 3.33 ug/mL-FEU — ABNORMAL HIGH (ref 0.00–0.48)

## 2012-04-19 LAB — COMPREHENSIVE METABOLIC PANEL
Alkaline Phosphatase: 71 U/L (ref 39–117)
BUN: 22 mg/dL (ref 6–23)
Chloride: 105 mEq/L (ref 96–112)
GFR calc Af Amer: 53 mL/min — ABNORMAL LOW (ref >90)
Glucose, Bld: 87 mg/dL (ref 70–99)
Potassium: 3.7 mEq/L (ref 3.5–5.1)
Total Bilirubin: 0.5 mg/dL (ref 0.3–1.2)

## 2012-04-19 LAB — TROPONIN I: Troponin I: <0.3 ng/mL (ref <0.30)

## 2012-04-19 MED ORDER — IOHEXOL 350 MG/ML SOLN
100.0000 mL | Freq: Once | INTRAVENOUS | Status: AC | PRN
  Administered 2012-04-19: 100 mL via INTRAVENOUS

## 2012-04-19 MED ORDER — RIVAROXABAN 20 MG PO TABS: 20.0000 mg | ORAL_TABLET | Freq: Every day | ORAL | Status: DC

## 2012-04-19 MED ORDER — GI COCKTAIL ~~LOC~~
30.0000 mL | Freq: Once | ORAL | Status: AC
  Administered 2012-04-19: 30 mL via ORAL
  Filled 2012-04-19: qty 30

## 2012-04-19 MED ORDER — FAMOTIDINE IN NACL 20-0.9 MG/50ML-% IV SOLN
20.0000 mg | Freq: Once | INTRAVENOUS | Status: AC
  Administered 2012-04-19: 20 mg via INTRAVENOUS
  Filled 2012-04-19: qty 50

## 2012-04-19 MED ORDER — SODIUM CHLORIDE 0.9 % IV SOLN
Freq: Once | INTRAVENOUS | Status: AC
  Administered 2012-04-19: 1000 mL via INTRAVENOUS

## 2012-04-19 MED ORDER — ENOXAPARIN SODIUM 80 MG/0.8ML ~~LOC~~ SOLN
60.0000 mg | Freq: Once | SUBCUTANEOUS | Status: AC
  Administered 2012-04-19: 60 mg via SUBCUTANEOUS
  Filled 2012-04-19: qty 0.8

## 2012-04-19 NOTE — ED Notes (Signed)
Patient ambulated to bathroom.  Patient walked on her own without any assistance.  Patient denies any shortness of breath.

## 2012-04-19 NOTE — ED Notes (Signed)
Pt states started having SOB about one hour ago.

## 2012-04-19 NOTE — ED Provider Notes (Signed)
History  This chart was scribed for Ward Givens, MD by Madaline Brilliant, ED Scribe. The patient was seen in room APA02/APA02. Patient's care was started at 0839.   CSN: [id number]  Arrival date & time 04/19/12  4098   First MD Initiated Contact with Patient 04/19/12 [phone]      Chief Complaint  Patient presents with  . Shortness of Breath   Patient is level 5 caveat for dementia  The history is provided by the patient and a relative. The history is limited by the condition of the patient. No language interpreter was used.   [name] is a 79 y.o. female who presents to the Emergency Department complaining of moderate,  constant SOB onset sometime during the night, moderate, epigastric pain along with the breathing difficulty. She presented to the ED earlier this week for same symptoms. Son states that patient has a history of hiatal hernia and was treated by Dr. Phillips Odor but that her her pain resumed either last night or this morning. She denies chest pain and anything more than minimal abdominal pain currently. Son states that she may have pushed medical alert this morning to call for help when she started having her shortness of breath.. She stated during HPI that she cannot breathe. Son states she has had this before when she was having her hiatal hernia problems. Son states that bronchitis and possible development of pneumonia was treated several days ago with antibiotics -- records show she was put on a Z-pack on 04/16/2012. According to her son, she has a chronic tremor at baseline, history of a cholecystectomy, and normally has a fairly normal appetite but has not been eating as much lately as normal. He states pt does not wear hearing aids however she is wearing them today.     PCP Dr Phillips Odor  Past Medical History  Diagnosis Date  . Hiatal hernia   . Dementia   . Thyroid disease   . GERD (gastroesophageal reflux disease)     History reviewed. No pertinent past surgical  history.  No family history on file.  History  Substance Use Topics  . Smoking status: Never Smoker   . Smokeless tobacco: Not on file  . Alcohol Use: No  Lives at home Lives alone  OB History    Grav Para Term Preterm Abortions TAB SAB Ect Mult Living                  Review of Systems  Unable to perform ROS   Allergies  Review of patient's allergies indicates no known allergies.  Home Medications   Current Outpatient Rx  Name  Route  Sig  Dispense  Refill  . ASPIRIN EC 81 MG PO TBEC   Oral   Take 81 mg by mouth every evening.         . AZITHROMYCIN 250 MG PO TABS      One daily for four more days, start Tuesday afternoon   6 each   0   . CALCIUM CARBONATE-VITAMIN D 500-200 MG-UNIT PO TABS   Oral   Take 1 tablet by mouth daily.           . DONEPEZIL HCL 10 MG PO TABS   Oral   Take 10 mg by mouth at bedtime.          Marland Kitchen LEVOTHYROXINE SODIUM 50 MCG PO TABS   Oral   Take 50 mcg by mouth daily.          Marland Kitchen  MEMANTINE HCL 5 MG PO TABS   Oral   Take 5 mg by mouth 2 (two) times daily.          . CENTRUM PO CHEW   Oral   Chew 1 tablet by mouth daily.           Marland Kitchen PANTOPRAZOLE SODIUM 20 MG PO TBEC   Oral   Take 20 mg by mouth daily.           Triage Vitals:  BP 128/71  Pulse 74  Temp 97.9 F (36.6 C) (Oral)  Resp 20  Wt 125 lb (56.7 kg)  SpO2 96%  Vital signs normal    Physical Exam  Nursing note and vitals reviewed. Constitutional: She is oriented to person, place, and time. She appears well-developed and well-nourished.  Non-toxic appearance. She does not appear ill. No distress.  HENT:  Head: Normocephalic and atraumatic.  Right Ear: External ear normal.  Left Ear: External ear normal.  Nose: Nose normal. No mucosal edema or rhinorrhea.  Mouth/Throat: Oropharynx is clear and moist and mucous membranes are normal. No dental abscesses or uvula swelling.  Eyes: Conjunctivae normal and EOM are normal. Pupils are equal, round, and  reactive to light.  Neck: Normal range of motion and full passive range of motion without pain. Neck supple.  Cardiovascular: Normal rate, regular rhythm and normal heart sounds.  Exam reveals no gallop and no friction rub.   No murmur heard. Pulmonary/Chest: Effort normal and breath sounds normal. No respiratory distress. She has no wheezes. She has no rhonchi. She has no rales. She exhibits no tenderness and no crepitus.  Abdominal: Soft. Normal appearance and bowel sounds are normal. She exhibits no distension. There is tenderness (She is tender in the epigastric area.). There is no rebound and no guarding.  Musculoskeletal: Normal range of motion. She exhibits no edema and no tenderness.       Moves all extremities well.   Neurological: She is alert and oriented to person, place, and time. She has normal strength. No cranial nerve deficit.       Head tremor.  Skin: Skin is warm, dry and intact. No rash noted. No erythema. No pallor.  Psychiatric: She has a normal mood and affect. Her speech is normal and behavior is normal. Her mood appears not anxious.    ED Course  Procedures (including critical care time)  Medications  pantoprazole (PROTONIX) 20 MG tablet (not administered)  famotidine (PEPCID) IVPB 20 mg (0 mg Intravenous Stopped 04/19/12 1015)  gi cocktail (Maalox,Lidocaine,Donnatal) (30 mL Oral Given 04/19/12 0940)  0.9 %  sodium chloride infusion (1000 mL Intravenous New Bag/Given 04/19/12 0939)  iohexol (OMNIPAQUE) 350 MG/ML injection 100 mL (100 mL Intravenous Contrast Given 04/19/12 1230)  enoxaparin (LOVENOX) injection 60 mg (60 mg Subcutaneous Given 04/19/12 1313)     DIAGNOSTIC STUDIES: Oxygen Saturation is 96% on [place], adequate by my interpretation.    COORDINATION OF CARE: [phone] Plan is to treat patient with GI cocktail, monitor hiatal hernia, and order CX. Patient informed of current plan for treatment and evaluation and agrees with plan at this time.  1132 Upon recheck,  patient appears to be in no distress. She is no longer complaining of shortness of breath. Patient has elevated d-dimer, but CT of chest will be ordered. Pt and caregiver were informed that her kidney function is normal asked about ordering a CT scan.  Son  informed of current plan for treatment and evaluation and agrees with  plan at this time.  13:05 Dr Bradly Chris, radiology called, states she has PE  1308 On follow-up, pt informed that radiologists called and reported finding of blood clots in her lungs which explains SOB. First does of lood thinner will be given. Consultation planned.  1438 Consult: Dr. Phillips Odor (PCP). Recommends discharging pt with Lovenox and coumadin. Didn't want to send home on xarelto.   1443 On follow-up, patient was informed of plan to discharge with Lovonox and Coumadin. However, pt lives at home with sitters, no medical personel.  Son states he doesn't want her to be admitted unless necessary because she gets disoriented terribly.   15:33 D/W Dr Karilyn Cota, feels she can go home with xarelto and that way she won't need nursing care or blood monitoring.   Results for orders placed during the hospital encounter of 04/19/12  TROPONIN I      Component Value Range   Troponin I <0.30  <0.30 ng/mL  CBC WITH DIFFERENTIAL      Component Value Range   WBC 6.9  4.0 - 10.5 K/uL   RBC 5.26 (*) 3.87 - 5.11 MIL/uL   Hemoglobin 15.4 (*) 12.0 - 15.0 g/dL   HCT 11.9 (*) 14.7 - 82.9 %   MCV 89.4  78.0 - 100.0 fL   MCH 29.3  26.0 - 34.0 pg   MCHC 32.8  30.0 - 36.0 g/dL   RDW 56.2  13.0 - 86.5 %   Platelets 214  150 - 400 K/uL   Neutrophils Relative 60  43 - 77 %   Neutro Abs 4.1  1.7 - 7.7 K/uL   Lymphocytes Relative 33  12 - 46 %   Lymphs Abs 2.3  0.7 - 4.0 K/uL   Monocytes Relative 6  3 - 12 %   Monocytes Absolute 0.4  0.1 - 1.0 K/uL   Eosinophils Relative 1  0 - 5 %   Eosinophils Absolute 0.1  0.0 - 0.7 K/uL   Basophils Relative 0  0 - 1 %   Basophils Absolute 0.0  0.0 - 0.1  K/uL  COMPREHENSIVE METABOLIC PANEL      Component Value Range   Sodium 141  135 - 145 mEq/L   Potassium 3.7  3.5 - 5.1 mEq/L   Chloride 105  96 - 112 mEq/L   CO2 25  19 - 32 mEq/L   Glucose, Bld 87  70 - 99 mg/dL   BUN 22  6 - 23 mg/dL   Creatinine, Ser 7.84  0.50 - 1.10 mg/dL   Calcium 9.2  8.4 - 69.6 mg/dL   Total Protein 6.5  6.0 - 8.3 g/dL   Albumin 3.3 (*) 3.5 - 5.2 g/dL   AST 18  0 - 37 U/L   ALT 9  0 - 35 U/L   Alkaline Phosphatase 71  39 - 117 U/L   Total Bilirubin 0.5  0.3 - 1.2 mg/dL   GFR calc non Af Amer 45 (*) >90 mL/min   GFR calc Af Amer 53 (*) >90 mL/min  D-DIMER, QUANTITATIVE      Component Value Range   D-Dimer, Quant 3.33 (*) 0.00 - 0.48 ug/mL-FEU  PRO B NATRIURETIC PEPTIDE      Component Value Range   Pro B Natriuretic peptide (BNP) 366.7  0 - 450 pg/mL   Dg Chest Portable 1 View  04/19/2012  *RADIOLOGY REPORT*  Clinical Data: Shortness of breath  PORTABLE CHEST - 1 VIEW  Comparison: Portable exam 0836 hours compared to  04/15/2012  Findings: Enlargement of cardiac silhouette with left ventricular prominence. Calcified tortuous aorta. Pulmonary vascularity normal. Bibasilar atelectasis and question underlying emphysematous changes. No gross infiltrate, pleural effusion or pneumothorax. Diffuse osseous demineralization.  IMPRESSION: Bronchitic and probable mild emphysematous changes with bibasilar atelectasis. Mild enlargement of cardiac silhouette with left ventricular prominence.   Original Report Authenticated By: Ulyses Southward, M.D.     1. Pulmonary embolism     New Prescriptions   RIVAROXABAN (XARELTO) 20 MG TABS    Take 1 tablet (20 mg total) by mouth daily.    Date: 04/19/2012  Rate: 61  Rhythm: normal sinus rhythm  QRS Axis: normal  Intervals: normal  ST/T Wave abnormalities: normal  Conduction Disutrbances:none  Narrative Interpretation:   Old EKG Reviewed: none available    Plan discharge  Devoria Albe, MD, FACEP   MDM   I personally  performed the services described in this documentation, which was scribed in my presence. The recorded information has been reviewed and considered.   Devoria Albe, MD, Armando Gang       Ward Givens, MD 04/19/12 [phone]

## 2012-04-22 ENCOUNTER — Emergency Department (HOSPITAL_COMMUNITY): Payer: Medicare Other

## 2012-04-22 ENCOUNTER — Encounter (HOSPITAL_COMMUNITY): Payer: Self-pay

## 2012-04-22 ENCOUNTER — Observation Stay (HOSPITAL_COMMUNITY)
Admission: EM | Admit: 2012-04-22 | Discharge: 2012-04-23 | Disposition: A | Discharge status: Home / Self Care | Payer: Medicare Other | Attending: Internal Medicine | Admitting: Internal Medicine

## 2012-04-22 DIAGNOSIS — K3189 Other diseases of stomach and duodenum: Secondary | ICD-10-CM

## 2012-04-22 DIAGNOSIS — R1013 Epigastric pain: Secondary | ICD-10-CM

## 2012-04-22 DIAGNOSIS — R0602 Shortness of breath: Secondary | ICD-10-CM

## 2012-04-22 DIAGNOSIS — K2990 Gastroduodenitis, unspecified, without bleeding: Secondary | ICD-10-CM

## 2012-04-22 DIAGNOSIS — Z8719 Personal history of other diseases of the digestive system: Secondary | ICD-10-CM

## 2012-04-22 DIAGNOSIS — I359 Nonrheumatic aortic valve disorder, unspecified: Secondary | ICD-10-CM

## 2012-04-22 DIAGNOSIS — H9190 Unspecified hearing loss, unspecified ear: Secondary | ICD-10-CM | POA: Diagnosis present

## 2012-04-22 DIAGNOSIS — K5730 Diverticulosis of large intestine without perforation or abscess without bleeding: Secondary | ICD-10-CM

## 2012-04-22 DIAGNOSIS — Z8659 Personal history of other mental and behavioral disorders: Secondary | ICD-10-CM

## 2012-04-22 DIAGNOSIS — I4892 Unspecified atrial flutter: Principal | ICD-10-CM

## 2012-04-22 DIAGNOSIS — F0390 Unspecified dementia without behavioral disturbance: Secondary | ICD-10-CM

## 2012-04-22 DIAGNOSIS — M6281 Muscle weakness (generalized): Secondary | ICD-10-CM | POA: Insufficient documentation

## 2012-04-22 DIAGNOSIS — I2699 Other pulmonary embolism without acute cor pulmonale: Secondary | ICD-10-CM

## 2012-04-22 DIAGNOSIS — E039 Hypothyroidism, unspecified: Secondary | ICD-10-CM

## 2012-04-22 DIAGNOSIS — K589 Irritable bowel syndrome without diarrhea: Secondary | ICD-10-CM

## 2012-04-22 HISTORY — DX: Diverticulosis of large intestine without perforation or abscess without bleeding: K57.30

## 2012-04-22 LAB — BASIC METABOLIC PANEL
BUN: 17 mg/dL (ref 6–23)
CO2: 27 mEq/L (ref 19–32)
Calcium: 9 mg/dL (ref 8.4–10.5)
Creatinine, Ser: 1.01 mg/dL (ref 0.50–1.10)

## 2012-04-22 LAB — CBC WITH DIFFERENTIAL/PLATELET
Basophils Absolute: 0 10*3/uL (ref 0.0–0.1)
Eosinophils Relative: 1 % (ref 0–5)
HCT: 48.2 % — ABNORMAL HIGH (ref 36.0–46.0)
Lymphocytes Relative: 27 % (ref 12–46)
MCHC: 32.6 g/dL (ref 30.0–36.0)
MCV: 89.3 fL (ref 78.0–100.0)
Monocytes Absolute: 0.4 10*3/uL (ref 0.1–1.0)
RDW: 14.1 % (ref 11.5–15.5)

## 2012-04-22 LAB — BLOOD GAS, ARTERIAL
Acid-base deficit: 0.6 mmol/L (ref 0.0–2.0)
Bicarbonate: 23.1 mEq/L (ref 20.0–24.0)
O2 Content: 4 L/min
O2 Saturation: 98.8 %
TCO2: 19.6 mmol/L (ref 0–100)
pO2, Arterial: 140 mmHg — ABNORMAL HIGH (ref 80.0–100.0)

## 2012-04-22 LAB — TROPONIN I: Troponin I: <0.3 ng/mL (ref <0.30)

## 2012-04-22 LAB — MAGNESIUM: Magnesium: 2 mg/dL (ref 1.5–2.5)

## 2012-04-22 MED ORDER — PANTOPRAZOLE SODIUM 20 MG PO TBEC: 20.0000 mg | DELAYED_RELEASE_TABLET | Freq: Every day | ORAL | Status: DC

## 2012-04-22 MED ORDER — ASPIRIN EC 81 MG PO TBEC
81.0000 mg | DELAYED_RELEASE_TABLET | Freq: Every day | ORAL | Status: DC
  Administered 2012-04-22 – 2012-04-23 (×2): 81 mg via ORAL
  Filled 2012-04-22 (×2): qty 1

## 2012-04-22 MED ORDER — MEMANTINE HCL 10 MG PO TABS
5.0000 mg | ORAL_TABLET | Freq: Two times a day (BID) | ORAL | Status: DC
Start: 2012-04-22 — End: 2012-04-23
  Administered 2012-04-22 – 2012-04-23 (×2): 5 mg via ORAL
  Filled 2012-04-22: qty 2
  Filled 2012-04-22 (×2): qty 1

## 2012-04-22 MED ORDER — SODIUM CHLORIDE 0.9 % IJ SOLN
3.0000 mL | Freq: Two times a day (BID) | INTRAMUSCULAR | Status: DC
  Administered 2012-04-23: 3 mL via INTRAVENOUS

## 2012-04-22 MED ORDER — CALCIUM CARBONATE-VITAMIN D 500-200 MG-UNIT PO TABS
1.0000 | ORAL_TABLET | Freq: Every day | ORAL | Status: DC
  Administered 2012-04-22 – 2012-04-23 (×2): 1 via ORAL
  Filled 2012-04-22 (×2): qty 1

## 2012-04-22 MED ORDER — LEVOTHYROXINE SODIUM 50 MCG PO TABS
50.0000 ug | ORAL_TABLET | Freq: Every day | ORAL | Status: DC
  Administered 2012-04-22 – 2012-04-23 (×2): 50 ug via ORAL
  Filled 2012-04-22 (×2): qty 1

## 2012-04-22 MED ORDER — ACETAMINOPHEN 325 MG PO TABS: 650.0000 mg | ORAL_TABLET | Freq: Four times a day (QID) | ORAL | Status: DC | PRN

## 2012-04-22 MED ORDER — SENNOSIDES-DOCUSATE SODIUM 8.6-50 MG PO TABS: 1.0000 | ORAL_TABLET | Freq: Every evening | ORAL | Status: DC | PRN

## 2012-04-22 MED ORDER — SODIUM CHLORIDE 0.9 % IV SOLN
INTRAVENOUS | Status: DC
  Administered 2012-04-22: 17:00:00 via INTRAVENOUS

## 2012-04-22 MED ORDER — LORAZEPAM 0.5 MG PO TABS
0.2500 mg | ORAL_TABLET | Freq: Two times a day (BID) | ORAL | Status: DC | PRN
  Administered 2012-04-22: 0.25 mg via ORAL
  Filled 2012-04-22: qty 1

## 2012-04-22 MED ORDER — RIVAROXABAN 10 MG PO TABS
15.0000 mg | ORAL_TABLET | Freq: Two times a day (BID) | ORAL | Status: DC
  Administered 2012-04-22 – 2012-04-23 (×3): 15 mg via ORAL
  Filled 2012-04-22 (×3): qty 2

## 2012-04-22 MED ORDER — MORPHINE SULFATE 2 MG/ML IJ SOLN: 1.0000 mg | INTRAMUSCULAR | Status: DC | PRN

## 2012-04-22 MED ORDER — LEVALBUTEROL HCL 0.63 MG/3ML IN NEBU: 0.6300 mg | INHALATION_SOLUTION | Freq: Four times a day (QID) | RESPIRATORY_TRACT | Status: DC | PRN

## 2012-04-22 MED ORDER — DONEPEZIL HCL 5 MG PO TABS
10.0000 mg | ORAL_TABLET | Freq: Every day | ORAL | Status: DC
  Administered 2012-04-22: 10 mg via ORAL
  Filled 2012-04-22: qty 2

## 2012-04-22 MED ORDER — ONDANSETRON HCL 4 MG/2ML IJ SOLN: 4.0000 mg | Freq: Four times a day (QID) | INTRAMUSCULAR | Status: DC | PRN

## 2012-04-22 MED ORDER — PANTOPRAZOLE SODIUM 40 MG PO TBEC
40.0000 mg | DELAYED_RELEASE_TABLET | Freq: Every day | ORAL | Status: DC
  Administered 2012-04-22 – 2012-04-23 (×2): 40 mg via ORAL
  Filled 2012-04-22 (×2): qty 1

## 2012-04-22 MED ORDER — SODIUM CHLORIDE 0.9 % IV SOLN: INTRAVENOUS | Status: DC

## 2012-04-22 MED ORDER — RIVAROXABAN 10 MG PO TABS: 20.0000 mg | ORAL_TABLET | Freq: Every day | ORAL | Status: DC

## 2012-04-22 MED ORDER — ONDANSETRON HCL 4 MG PO TABS: 4.0000 mg | ORAL_TABLET | Freq: Four times a day (QID) | ORAL | Status: DC | PRN

## 2012-04-22 MED ORDER — ACETAMINOPHEN 650 MG RE SUPP: 650.0000 mg | Freq: Four times a day (QID) | RECTAL | Status: DC | PRN

## 2012-04-22 MED ORDER — ALUM & MAG HYDROXIDE-SIMETH 200-200-20 MG/5ML PO SUSP: 30.0000 mL | Freq: Four times a day (QID) | ORAL | Status: DC | PRN

## 2012-04-22 NOTE — Progress Notes (Signed)
*  PRELIMINARY RESULTS* Echocardiogram 2D Echocardiogram has been performed.  Conrad [place] 04/22/2012, 4:27 PM

## 2012-04-22 NOTE — Consult Note (Signed)
CARDIOLOGY CONSULT NOTE  Patient ID: EARLENA WERST MRN: [id number] DOB/AGE: 1922-06-12 79 y.o.  Admit date: 04/22/2012 Referring Physician: PTH-Gosrani Primary PhysicianGOLDING, Chancy Hurter, MD Primary Cardiologist:New-Eleaner Dibartolo Reason for Consultation: New onset Atrial flutter  :Principal Problem:  *Atrial flutter by electrocardiogram Active Problems:  HYPOTHYROIDISM  DYSPEPSIA, NONULCERATIVE  DEMENTIA, HX OF  Pulmonary embolus  Shortness of breath  HPI:  Mrs. Graley is a very pleasant, hard of hearing, 58 patient we are asked to see for new onset atrial flutter.She has no prior cardiac history.  She has a recent history of PE which was diagnosed on January 17, and seen in ER due to dyspnea. She was started on Xarelto and refused admission. Family stayed with her when she got home.  At that time, review of EKG demonstrated NSR. When she returned home, she began to have sudden onsets of severe shortness of breath lasting for a few minutes causing her to have some panic and anxiety. These usually occurred in the morning, at rest, and subsided after 5-10 minutes without associated chest pain, but she did feel some epigastric pain which she attributed to hiatal hernia history.  These episodes were witnessed by both her son and her daughter. At other times, the patient is in her usual state of health, is as active as she can be with history of prior hip fracture in the past. She has periods of weakness which are not unusual for her. Her son and daughter have concerns about care for her at home, as she currently lives alone, but is beginning to have some mild dementia and is becoming more frail..Other history includes hypothyroidism and GERD.    On evaluation in ER she was found to be in atrial flutter with rate of 114 bpm. She was treated with IV fluids and has returned to NSR. Review of labs are unremarkable, negative troponin X3. No evidence of CHF on CXR. CT on the 17th, positive for left upper lobe  PE. No evaluation for DVT was completed during ER evaluation on the 17th.   Review of systems complete and found to be negative unless listed above   Past Medical History  Diagnosis Date  . Hiatal hernia   . Dementia   . Thyroid disease   . GERD (gastroesophageal reflux disease)   . Diverticulosis of colon (without mention of hemorrhage)     Diverticulosis    History reviewed. Patient has dementia with no recollection of family history on questioning  History   Social History  . Marital Status: Widowed    Spouse Name: N/A    Number of Children: N/A  . Years of Education: N/A   Occupational History  . Not on file.   Social History Main Topics  . Smoking status: Never Smoker   . Smokeless tobacco: Not on file  . Alcohol Use: No  . Drug Use: No  . Sexually Active: Not Currently   Other Topics Concern  . Not on file   Social History Narrative  . No narrative on file    History reviewed. No pertinent past surgical history.   Prescriptions prior to admission  Medication Sig Dispense Refill  . calcium-vitamin D (OSCAL WITH D) 500-200 MG-UNIT per tablet Take 1 tablet by mouth daily.        Marland Kitchen donepezil (ARICEPT) 10 MG tablet Take 10 mg by mouth at bedtime.       Marland Kitchen levothyroxine (SYNTHROID, LEVOTHROID) 50 MCG tablet Take 50 mcg by mouth daily.       Marland Kitchen  memantine (NAMENDA) 5 MG tablet Take 5 mg by mouth 2 (two) times daily.       . pantoprazole (PROTONIX) 20 MG tablet Take 20 mg by mouth daily.      . Rivaroxaban (XARELTO) 20 MG TABS Take 1 tablet (20 mg total) by mouth daily.  30 tablet  0    Physical Exam: Blood pressure 112/72, pulse 61, temperature 98 F (36.7 C), temperature source Oral, resp. rate 15, SpO2 100.00%.   General: Well developed, well nourished, in no acute distress, frail Head: Eyes PERRLA, No xanthomas.   Normal cephalic and atramatic  Lungs: Clear bilaterally to auscultation and percussion. Heart: HRRR S1 S2, with soft systolic murmru  Pulses are 2+  & equal.            No carotid bruit. No JVD.  No abdominal bruits. No femoral bruits. Abdomen: Bowel sounds are positive, abdomen soft and non-tender without masses or                  Hernia's noted. Msk:  Back normal, normal, but unsteady gait. Normal strength and tone for age. Extremities: No clubbing, cyanosis or edema. Venous stasis skin changes. No significant warmth or pain in the LE bilaterally. Left leg slightly larger than the right.  DP +1 Neuro: Alert and oriented X 2. Very hard of hearing. Essential tremor noted Psych:  Good affect, responds appropriately   Labs:   Lab Results  Component Value Date   WBC 7.9 04/22/2012   HGB 15.7* 04/22/2012   HCT 48.2* 04/22/2012   MCV 89.3 04/22/2012   PLT 241 04/22/2012    Lab 04/22/12 1012 04/19/12 0907  NA 142 --  K 3.5 --  CL 107 --  CO2 27 --  BUN 17 --  CREATININE 1.01 --  CALCIUM 9.0 --  PROT -- 6.5  BILITOT -- 0.5  ALKPHOS -- 71  ALT -- 9  AST -- 18  GLUCOSE 105* --   Lab Results  Component Value Date   TROPONINI <0.30 04/22/2012    No results found for this basename: CHOL        Radiology: Dg Chest Port 1 View  04/22/2012  *RADIOLOGY REPORT*  Clinical Data: Shortness of breath  PORTABLE CHEST - 1 VIEW  Comparison: 01/17/ 14  Findings: Cardiomediastinal silhouette is stable.  No acute infiltrate or pulmonary edema.  Stable left basilar atelectasis or scarring.  Mild hyperinflation again noted.  IMPRESSION: No active disease.  No significant change.   Original Report Authenticated By: Natasha Mead, M.D.    GNF:AOZHYQ flutter rate of 114 bpm  ASSESSMENT AND PLAN:   1.New Onset Atrial flutter: With IV hydration and O2  she is returned to NSR rates in the 60-70;s She is without complaint. Likely caused by physiologic stress of PE and dyspnea. She did not appear to be dehydrated on admission labs, but has responded well to IV hydration. Echocardiogram has been ordered, but doubt results will change management unless there  is significant LV dysfunction. Continue Xarelto as this appears to be a good choice for her to avoid frequent labs and transport for lab checks. Consider HHN or even SNF placement for family concerns for home care/follow up on discharge.  2. Left Upper Lobe PE: Newly diagnosed 3 days ago. Now on novel anticoagulant. Dyspnea may be related to this, as this is still prominent after such a short time. Would be academic only to check for DVT unless there is a consideration for  IVC filter, if she is unable to tolerate anticoagulation therapy.   3. Hypothyroidism: Labs being checked  4. Overall deconditioning and frail state: PT to evaluate today. Case management to address family concerns about taking her home on discharge vs placement. Signed: Bettey Mare. Lyman Bishop NP [facility] 04/22/2012, 4:26 PM Co-Sign MD I have taken a history, reviewed medications, allergies, PMH, SH, FH, and reviewed ROS and examined the patient.  I agree with the assessment and plan. If recurs, will try for rate control and anticoagulation unless blood pressure to soft.  Amiracle Neises C. Daleen Squibb, MD, [facility] Pager:  [phone]

## 2012-04-22 NOTE — H&P (Signed)
[facility] History and Physical  [name] WUJ:[id number] DOB: 12/13/1922 DOA: 04/22/2012  Referring physician: Dr. Deretha Emory PCP: Colette Ribas, MD  Specialists:   Chief Complaint: help me breathe  HPI: [name] is a 79 y.o. female is very hard of hearing with dementia, thyroid disease and gerd. She was diagnosed with a pulmonary embolus on April 19, 2012, started on Xeralto in the emergency department and discharged. She lives at home alone and is frequently checked on by both her son and daughter (who are her medical power of attorney).  [name] is so hard of hearing that she is unable to give much history. History is collected from her son and daughter, who tell me that she has become very weak at times. The weakness is usually worse in the morning and later in the evening. With the weakness she has episodes of shortness of breath. Also during these spells she develops anxiety which makes her breathing worse.  She has had no sick contacts. And has no complaints other than her breathing. Her son reports that 3 weeks ago she did have a cough, but it resolved on its own without seeing a physician. The patient eats well, and ambulates on her own at home by holding onto the walls and furniture.  Her family is concerned that she can no longer be left at home alone. It is becoming very challenging for them to care for her. They would like to consider placement.  In the emergency department her labs are unrevealing, chest x-ray is negative, but her EKG shows atrial flutter with nonspecific T wave  Changes.  ER notes mention atrial flutter with variable AV block on the telemetry monitor.  EKG on January 17 showed normal sinus rhythm.  [name] will be admitted for difficulty breathing with new onset atrial flutter, and possible placement.    Review of Systems: [name] denies anorexia, dysphagia, headache, dizziness, changes in vision, fever, cough, vomiting, changes in bowel  habits, chest pain, abdominal pain, extremity swelling. All other systems were reviewed and found to be negative.  Past Medical History  Diagnosis Date  . Hiatal hernia   . Dementia   . Thyroid disease   . GERD (gastroesophageal reflux disease)    History reviewed. No pertinent past surgical history. Social History:  reports that she has never smoked. She does not have any smokeless tobacco history on file. She reports that she does not drink alcohol or use illicit drugs. Currently lives at home, requires assistance with ADLs  No Known Allergies  No family history on file.  her father died in his early 41s with heart disease. She had a sister that died with heart disease. She had multiple brothers that died with cancer. Her family is unable to give me any more detailed history  Prior to Admission medications   Medication Sig Start Date End Date Taking? Authorizing Provider  calcium-vitamin D (OSCAL WITH D) 500-200 MG-UNIT per tablet Take 1 tablet by mouth daily.     Yes Historical Provider, MD  donepezil (ARICEPT) 10 MG tablet Take 10 mg by mouth at bedtime.    Yes Historical Provider, MD  levothyroxine (SYNTHROID, LEVOTHROID) 50 MCG tablet Take 50 mcg by mouth daily.    Yes Historical Provider, MD  memantine (NAMENDA) 5 MG tablet Take 5 mg by mouth 2 (two) times daily.    Yes Historical Provider, MD  pantoprazole (PROTONIX) 20 MG tablet Take 20 mg by mouth daily.  Yes Historical Provider, MD  Rivaroxaban (XARELTO) 20 MG TABS Take 1 tablet (20 mg total) by mouth daily. 04/19/12  Yes Ward Givens, MD   Physical Exam: Filed Vitals:   04/22/12 0920 04/22/12 0928 04/22/12 1126 04/22/12 1130  BP: 109/75  133/79 133/79  Pulse: 57 70  69  Temp: 98 F (36.7 C)     Resp: 20  14 20   SpO2:  100% 100% 100%     General:  Well-developed well-nourished elderly Caucasian female, no apparent distress, stable.  Very hard of hearing.  Eyes: Sclera clear, conjunctiva pain, pupils equal and  round  ENT: Oropharynx is pink without erythema or exudates, mucous membranes are moist  Neck: Supple without lymphadenopathy, thyromegaly, or obvious JVD  Cardiovascular: Regular rate and rhythm, with systolic murmur, no rubs or gallops, no lower extremity edema, positive lower extremity telangiectasia  Respiratory: No wheezes crackles or rales, no accessory muscle movement. No pleural rub.  Abdomen: Soft, nontender, nondistended, well-healed scar, positive bowel sounds, no masses  Skin: No rashes, lesions, bruises, skin is very thin on the upper arms,  Musculoskeletal: Able to move all 4 extremities, strength symmetric  Psychiatric: Calm, appears alert and oriented, difficult to assess, very hard of hearing.  Neurologic: Very hard of hearing, otherwise no obvious deficits  Labs on Admission:  Basic Metabolic Panel:  Lab 04/22/12 1610 04/19/12 0907 04/15/12 1357  NA 142 141 141  K 3.5 3.7 4.5  CL 107 105 103  CO2 27 25 28   GLUCOSE 105* 87 98  BUN 17 22 17   CREATININE 1.01 1.07 1.01  CALCIUM 9.0 9.2 9.9  MG -- -- --  PHOS -- -- --   Liver Function Tests:  Lab 04/19/12 0907  AST 18  ALT 9  ALKPHOS 71  BILITOT 0.5  PROT 6.5  ALBUMIN 3.3*    CBC:  Lab 04/22/12 1012 04/19/12 0907 04/15/12 1357  WBC 7.9 6.9 6.7  NEUTROABS 5.3 4.1 4.5  HGB 15.7* 15.4* 15.8*  HCT 48.2* 47.0* 48.3*  MCV 89.3 89.4 89.3  PLT 241 214 194   Cardiac Enzymes:  Lab 04/19/12 0907  CKTOTAL --  CKMB --  CKMBINDEX --  TROPONINI <0.30    BNP (last 3 results)  Basename 04/19/12 0907  PROBNP 366.7    Radiological Exams on Admission: Dg Chest Port 1 View  04/22/2012  *RADIOLOGY REPORT*  Clinical Data: Shortness of breath  PORTABLE CHEST - 1 VIEW  Comparison: 01/17/ 14  Findings: Cardiomediastinal silhouette is stable.  No acute infiltrate or pulmonary edema.  Stable left basilar atelectasis or scarring.  Mild hyperinflation again noted.  IMPRESSION: No active disease.  No significant  change.   Original Report Authenticated By: Natasha Mead, M.D.     EKG: A. flutter with a ventricular rate of 114.  Assessment/Plan Principal Problem:  *Atrial flutter by electrocardiogram Active Problems:  HYPOTHYROIDISM  DYSPEPSIA, NONULCERATIVE  DEMENTIA, HX OF  Pulmonary embolus  Shortness of breath  Pulmonary Embolus Diagnosed 1/17 on CT angio chest Started Xeralto on 1/17. No hemodynamic instability. Patient, per report from daughter, is not very mobile at home and this may be the etiology of her pulmonary embolism, presumably originating from a DVT.    New Onset Aflutter Demonstrated on admission EKG Patient in sinus on my exam in ED Cardiology consultation requested (family requests [place]) Patient anticoagulated with Xeralto Check Magnesium, TSH, BNP, Ti X3, 2D echo   Hiatal Hernia / Genella Rife Continue PPI  Hypothyroidism Check TSH, Free  T4 Continue Synthroid  Dementia-mild. Continue Aricept and Namenda   Code Status:  DNR/DNI Family Communication:  Son and daughter are at bedside Disposition Plan: to be determined. PT / OT evaluation pending.  Time spent: 60 min.  Conley Canal [facility] Pager [phone]  If 7PM-7AM, please contact night-coverage [email or web address] Password [facility] 04/22/2012, 12:57 PM  Attending Patient seen and note above reviewed and modification made. Patient has a left upper lobe pulmonary embolism, does not appear to be large. She has mild dementia and there is concern from family members, especially her son, regarding her staying in her own home. There is a caregiver that comes daily, but only for a few hours. The patient clearly wants to stay at her own home. She has mild dementia and does appear to manage with most activities of daily living. She is able to dress, bathe, feed herself.  Plan: 1. Admit to telemetry. Monitor rhythm, currently she is in sinus rhythm. 2. Oral anticoagulation with xeralto. I would adjust the  dose to 15 mg twice a day for 3 weeks and then 20 mg daily. 3. Physical and occupational therapy input. 4. Should be discharge from the hospital in the next day or 2 providing she shows stability.

## 2012-04-22 NOTE — ED Provider Notes (Addendum)
History    This chart was scribed for Tiffany Jakes, MD, MD by Smitty Pluck, ED Scribe. The patient was seen in room APA10/APA10 and the patient's care was started at 9:51 AM.   CSN: [id number]  Arrival date & time 04/22/12  0919      Chief Complaint  Patient presents with  . Shortness of Breath    Patient is a 79 y.o. female presenting with shortness of breath. The history is provided by medical records and a relative. No language interpreter was used.  Shortness of Breath  The onset was gradual. The problem occurs frequently. The problem has been unchanged. The problem is moderate. Associated symptoms include shortness of breath. There was no intake of a foreign body. The Heimlich maneuver was not attempted. She was not exposed to toxic fumes. She has not inhaled smoke recently.  Pt is level 5 caveat due to dementia.  [name] is a 79 y.o. female who presents to the Emergency Department BIB EMS accompanied by son complaining of constant, moderate SOB onset 3 days. Pt was in ED 3 days ago for same and diagnosed with PE on left side by CT angio. Pt was discharged and started on xarelto. Son states that symptoms have remained since being discharged from ED. Pt lives alone but has someone to sit with her during the day. Pt's son reports that pt has gotten weaker over the past couple of days. Per son pt has tremor at baseline. Son reports that pt has episodes of severe tremor and SOB.   Past Medical History  Diagnosis Date  . Hiatal hernia   . Dementia   . Thyroid disease   . GERD (gastroesophageal reflux disease)     History reviewed. No pertinent past surgical history.  No family history on file.  History  Substance Use Topics  . Smoking status: Never Smoker   . Smokeless tobacco: Not on file  . Alcohol Use: No    OB History    Grav Para Term Preterm Abortions TAB SAB Ect Mult Living                  Review of Systems  Unable to perform ROS Respiratory:  Positive for shortness of breath.     Allergies  Review of patient's allergies indicates no known allergies.  Home Medications   Current Outpatient Rx  Name  Route  Sig  Dispense  Refill  . CALCIUM CARBONATE-VITAMIN D 500-200 MG-UNIT PO TABS   Oral   Take 1 tablet by mouth daily.           . DONEPEZIL HCL 10 MG PO TABS   Oral   Take 10 mg by mouth at bedtime.          [name] LEVOTHYROXINE SODIUM 50 MCG PO TABS   Oral   Take 50 mcg by mouth daily.          [name] MEMANTINE HCL 5 MG PO TABS   Oral   Take 5 mg by mouth 2 (two) times daily.          [name] PANTOPRAZOLE SODIUM 20 MG PO TBEC   Oral   Take 20 mg by mouth daily.         [name] RIVAROXABAN 20 MG PO TABS   Oral   Take 1 tablet (20 mg total) by mouth daily.   30 tablet   0     BP 109/75  Pulse 70  Temp 98 F (36.7  C)  Resp 20  SpO2 100%  Physical Exam  Nursing note and vitals reviewed. Constitutional: She is oriented to person, place, and time. She appears well-developed and well-nourished. No distress.  HENT:  Head: Normocephalic and atraumatic.  Eyes: Conjunctivae normal are normal.  Neck: Normal range of motion. Neck supple.  Cardiovascular: Normal rate and normal heart sounds.  An irregular rhythm present.  No murmur heard.      Pt appears to have atrial fibrillation when looking at monitor.  Pulmonary/Chest: Effort normal and breath sounds normal. No respiratory distress. She has no wheezes. She has no rales.  Abdominal: Soft. Bowel sounds are normal. She exhibits no distension. There is no tenderness. There is no rebound and no guarding.  Musculoskeletal: Normal range of motion.       Swelling in bilateral lower extremities   Neurological: She is alert and oriented to person, place, and time. No cranial nerve deficit.       Follows commands well.   Skin: Skin is warm and dry.  Psychiatric: She has a normal mood and affect. Her behavior is normal.    ED Course  Procedures (including critical care  time) DIAGNOSTIC STUDIES: Oxygen Saturation is 100% on [place], normal by my interpretation.    COORDINATION OF CARE: 9:58 AM Discussed ED treatment with pt  10:13 AM Ordered:   Medications  0.9 %  sodium chloride infusion (not administered)       Labs Reviewed  CBC WITH DIFFERENTIAL - Abnormal; Notable for the following:    RBC 5.40 (*)     Hemoglobin 15.7 (*)     HCT 48.2 (*)     All other components within normal limits  BASIC METABOLIC PANEL - Abnormal; Notable for the following:    Glucose, Bld 105 (*)     GFR calc non Af Amer 49 (*)     GFR calc Af Amer 56 (*)     All other components within normal limits  PROTIME-INR   Dg Chest Port 1 View  04/22/2012  *RADIOLOGY REPORT*  Clinical Data: Shortness of breath  PORTABLE CHEST - 1 VIEW  Comparison: 01/17/ 14  Findings: Cardiomediastinal silhouette is stable.  No acute infiltrate or pulmonary edema.  Stable left basilar atelectasis or scarring.  Mild hyperinflation again noted.  IMPRESSION: No active disease.  No significant change.   Original Report Authenticated By: [name], M.D.    Results for orders placed during the hospital encounter of 04/22/12  [facility]      Component Value Range   Prothrombin Time 14.4  11.6 - 15.2 seconds   INR 1.14  0.00 - 1.49  CBC WITH DIFFERENTIAL      Component Value Range   WBC 7.9  4.0 - 10.5 K/uL   RBC 5.40 (*) 3.87 - 5.11 MIL/uL   Hemoglobin 15.7 (*) 12.0 - 15.0 g/dL   HCT 30.8 (*) 65.7 - 84.6 %   MCV 89.3  78.0 - 100.0 fL   MCH 29.1  26.0 - 34.0 pg   MCHC 32.6  30.0 - 36.0 g/dL   RDW 96.2  95.2 - 84.1 %   Platelets 241  150 - 400 K/uL   Neutrophils Relative 67  43 - 77 %   Neutro Abs 5.3  1.7 - 7.7 K/uL   Lymphocytes Relative 27  12 - 46 %   Lymphs Abs 2.1  0.7 - 4.0 K/uL   Monocytes Relative 5  3 - 12 %   Monocytes Absolute  0.4  0.1 - 1.0 K/uL   Eosinophils Relative 1  0 - 5 %   Eosinophils Absolute 0.1  0.0 - 0.7 K/uL   Basophils Relative 0  0 - 1 %   Basophils Absolute  0.0  0.0 - 0.1 K/uL  BASIC METABOLIC PANEL      Component Value Range   Sodium 142  135 - 145 mEq/L   Potassium 3.5  3.5 - 5.1 mEq/L   Chloride 107  96 - 112 mEq/L   CO2 27  19 - 32 mEq/L   Glucose, Bld 105 (*) 70 - 99 mg/dL   BUN 17  6 - 23 mg/dL   Creatinine, Ser 1.61  0.50 - 1.10 mg/dL   Calcium 9.0  8.4 - 09.6 mg/dL   GFR calc non Af Amer 49 (*) >90 mL/min   GFR calc Af Amer 56 (*) >90 mL/min    Date: 04/22/2012  Rate: 114  Rhythm: atrial flutter  QRS Axis: normal  Intervals: normal  ST/T Wave abnormalities: nonspecific T wave changes  Conduction Disutrbances:none  Narrative Interpretation:   Old EKG Reviewed: changes noted Some artifact on today's EKG looking at the EKG and definitely on the monitor there seems to be some periods of atrial flutter with variable AV block. Do not think there is any premature ventricular contractions. EKG on January 17 was normal sinus rhythm the heart rate is 61.    Results for orders placed during the hospital encounter of 04/22/12  [facility]      Component Value Range   Prothrombin Time 14.4  11.6 - 15.2 seconds   INR 1.14  0.00 - 1.49  CBC WITH DIFFERENTIAL      Component Value Range   WBC 7.9  4.0 - 10.5 K/uL   RBC 5.40 (*) 3.87 - 5.11 MIL/uL   Hemoglobin 15.7 (*) 12.0 - 15.0 g/dL   HCT 04.5 (*) 40.9 - 81.1 %   MCV 89.3  78.0 - 100.0 fL   MCH 29.1  26.0 - 34.0 pg   MCHC 32.6  30.0 - 36.0 g/dL   RDW 91.4  78.2 - 95.6 %   Platelets 241  150 - 400 K/uL   Neutrophils Relative 67  43 - 77 %   Neutro Abs 5.3  1.7 - 7.7 K/uL   Lymphocytes Relative 27  12 - 46 %   Lymphs Abs 2.1  0.7 - 4.0 K/uL   Monocytes Relative 5  3 - 12 %   Monocytes Absolute 0.4  0.1 - 1.0 K/uL   Eosinophils Relative 1  0 - 5 %   Eosinophils Absolute 0.1  0.0 - 0.7 K/uL   Basophils Relative 0  0 - 1 %   Basophils Absolute 0.0  0.0 - 0.1 K/uL  BASIC METABOLIC PANEL      Component Value Range   Sodium 142  135 - 145 mEq/L   Potassium 3.5  3.5 - 5.1 mEq/L     Chloride 107  96 - 112 mEq/L   CO2 27  19 - 32 mEq/L   Glucose, Bld 105 (*) 70 - 99 mg/dL   BUN 17  6 - 23 mg/dL   Creatinine, Ser 2.13  0.50 - 1.10 mg/dL   Calcium 9.0  8.4 - 08.6 mg/dL   GFR calc non Af Amer 49 (*) >90 mL/min   GFR calc Af Amer 56 (*) >90 mL/min   Ct Abdomen Pelvis Wo Contrast  04/19/2012  *RADIOLOGY REPORT*  Clinical  Data: Epigastric abdominal pain  CT ABDOMEN AND PELVIS WITHOUT CONTRAST  Technique:  Multidetector CT imaging of the abdomen and pelvis was performed following the standard protocol without intravenous contrast.  Comparison: 11/12/2008; 03/16/2004; abdominal MRI - 11/19/2008  Findings:  The lack of intravenous contrast limits the ability to evaluate solid abdominal organs.  Normal hepatic contour.  There are multiple punctate calcifications within the liver, likely the sequela of prior granulomatous infection.  Post cholecystectomy.  There is suggestion of mild intrahepatic biliary ductal dilatation as demonstrated on prior contrasted examinations.  No definite ascites.  Normal noncontrast appearance of the bilateral kidneys.  No renal stones.  No urinary obstruction or perinephric stranding.  Normal noncontrast appearance of the bilateral adrenal glands, pancreas and spleen.  Note is again made of a large duodenal diverticulum, likely unchanged given degree of distension.  Extensive colonic diverticulosis without evidence of diverticulitis on this noncontrast examination.  The bowel otherwise normal in course and caliber without evidence of obstruction.  The appendix is not definitely identified, however there is no inflammatory change in the right lower abdominal quadrant.  No pneumoperitoneum, pneumatosis or portal venous gas.  Scattered atherosclerotic plaque within a mildly tortuous and slightly ectatic abdominal aorta with the infrarenal abdominal aorta measuring approximately 2.0 cm in greatest transverse axial dimension (image 34, series 2).  No definite  retroperitoneal, mesenteric, pelvic or inguinal lymphadenopathy on this noncontrast examination.  Post hysterectomy.  No discrete adnexal lesion on this noncontrast examination which is further degraded secondary to streak artifact from the right total hip replacement.  Limited visualization of the lower thorax demonstrates worsening bibasilar dependent opacities, right greater than left.  No definite pleural effusion.  Cardiomegaly.  No pericardial effusion.  No acute or aggressive osseous abnormalities.  Moderate to severe multilevel focal lumbar spine degenerative change. Mild focal concavity involving the superior endplate of L1 vertebral body, new since prior examination performed 11/2008. Post left total hip replacement.  IMPRESSION:  1.  No explanation for patient's epigastric abdominal pain. Specifically, no evidence of enteric or urinary obstruction. 2.  Likely unchanged appearance of known large duodenal diverticulum. 3.  Colonic diverticulosis without evidence of diverticulitis. 4.  Post cholecystectomy.  Intrahepatic biliary ductal dilatation is suspected on this noncontrast examination as has been previously demonstrated on prior remote contrast enhanced abdominal MRI and CT examination performed in 2010. 5.  Scattered atherosclerotic plaque within a tortuous and mildly ectatic abdominal aorta. 6.  Age indeterminate mild focal concavity involving the superior endplate of L1.  Correlation for point tenderness at this location is recommended.  7. Worsening bibasilar opacities, right greater than left, atelectasis versus infiltrate.  Further evaluation with a PA and lateral chest radiograph may be obtained as clinically indicated.   Original Report Authenticated By: Tacey Ruiz, MD    Ct Angio Chest W/cm &/or Wo Cm  04/19/2012  *RADIOLOGY REPORT*  Clinical Data: Shortness of breath  CT ANGIOGRAPHY CHEST  Technique:  Multidetector CT imaging of the chest using the standard protocol during bolus  administration of intravenous contrast. Multiplanar reconstructed images including MIPs were obtained and reviewed to evaluate the vascular anatomy.  Contrast: OMNIPAQUE IOHEXOL 350 MG/ML SOLN  Comparison: 04/19/2012  Findings: Subpleural consolidation is noted in both lung bases. There is bibasilar atelectasis identified.  Heart size is mildly enlarged.  No pericardial effusion. Pulmonary artery to the basilar portion the left upper lobe contains a low attenuating filling defects consistent with acute pulmonary embolus, image number 32 of series  4.  No additional pulmonary emboli identified.  Limited imaging through the upper abdomen shows multiple granulomas within the liver.  There is multilevel degenerative disc disease within the lumbar spine.  T10 Schmorl's node deformity is identified, image 54.  IMPRESSION:  1.  Examination is positive for acute pulmonary embolus to the left upper lobe. 2.  Mild bilateral lower lobe subpleural consolidation and subsegmental atelectasis.  Critical Value/emergent results were called by telephone at the time of interpretation on 04/19/2012 at 1300 hours to Dr. Lynelle Doctor, who verbally acknowledged these results.   Original Report Authenticated By: Signa Kell, M.D.    Dg Chest Port 1 View  04/22/2012  *RADIOLOGY REPORT*  Clinical Data: Shortness of breath  PORTABLE CHEST - 1 VIEW  Comparison: 01/17/ 14  Findings: Cardiomediastinal silhouette is stable.  No acute infiltrate or pulmonary edema.  Stable left basilar atelectasis or scarring.  Mild hyperinflation again noted.  IMPRESSION: No active disease.  No significant change.   Original Report Authenticated By: [name], M.D.    Dg Chest Portable 1 View  04/19/2012  *RADIOLOGY REPORT*  Clinical Data: Shortness of breath  PORTABLE CHEST - 1 VIEW  Comparison: Portable exam 0836 hours compared to 04/15/2012  Findings: Enlargement of cardiac silhouette with left ventricular prominence. Calcified tortuous aorta. Pulmonary  vascularity normal. Bibasilar atelectasis and question underlying emphysematous changes. No gross infiltrate, pleural effusion or pneumothorax. Diffuse osseous demineralization.  IMPRESSION: Bronchitic and probable mild emphysematous changes with bibasilar atelectasis. Mild enlargement of cardiac silhouette with left ventricular prominence.   Original Report Authenticated By: Ulyses Southward, M.D.    Dg Chest Portable 1 View  04/15/2012  *RADIOLOGY REPORT*  Clinical Data: Chest pain.  PORTABLE CHEST - 1 VIEW  Comparison: CT chest and chest radiograph 02/09/2011.  Findings: Trachea is midline.  Heart size stable.  Lungs are low in volume with mild bibasilar air space disease.  Possible small left pleural effusion.  IMPRESSION: Low lung volumes with mild bibasilar air space disease and possible small left pleural effusion.   Original Report Authenticated By: Leanna Battles, M.D.       1. Pulmonary embolism   2. Atrial flutter       MDM  Patient with history of dementia. Lives by herself. Patient was seen in ED on January 17 with CT confirmed pulmonary embolism. After discussion with her primary care Dr. In the hospital she was started also rolled then discharged home. However family members have been needing to stay with her day and night even though she does have a sitter during the day. Patient is by herself at night. She continues to have some periods of shortness of breath and attacks of severe difficulty breathing. Family states that really no worse than they were since Friday. It is becoming very challenging for them to be able to take care of her at home.  Today's workup without any significant changes. Patient was started on and has continued Xarelto. Patient needs admission and may need referral to rehabilitation. We'll discuss with the hospitalist his primary care doctor is Dr. Phillips Odor he no longer admits.      I personally performed the services described in this documentation, which was  scribed in my presence. The recorded information has been reviewed and is accurate.     Tiffany Jakes, MD 04/22/12 1119  Tiffany Jakes, MD 04/22/12 2024

## 2012-04-22 NOTE — ED Notes (Signed)
Pt dx with PE this past Friday in ER. Pt went home with medication. Pt not on home O2. Pt woke up this am with difficulty breathing. " I just feel like I can't catch my breath"

## 2012-04-22 NOTE — ED Notes (Signed)
Attempted to give report nurse at lunch

## 2012-04-23 DIAGNOSIS — H9190 Unspecified hearing loss, unspecified ear: Secondary | ICD-10-CM | POA: Diagnosis present

## 2012-04-23 LAB — BASIC METABOLIC PANEL
BUN: 17 mg/dL (ref 6–23)
CO2: 24 mEq/L (ref 19–32)
Chloride: 106 mEq/L (ref 96–112)
GFR calc Af Amer: 69 mL/min — ABNORMAL LOW (ref >90)
Glucose, Bld: 85 mg/dL (ref 70–99)
Potassium: 3.2 mEq/L — ABNORMAL LOW (ref 3.5–5.1)

## 2012-04-23 LAB — CBC
HCT: 42.4 % (ref 36.0–46.0)
Hemoglobin: 14 g/dL (ref 12.0–15.0)
WBC: 7.5 10*3/uL (ref 4.0–10.5)

## 2012-04-23 LAB — T4, FREE: Free T4: 1.27 ng/dL (ref 0.80–1.80)

## 2012-04-23 MED ORDER — LORAZEPAM 0.5 MG PO TABS: 0.2500 mg | ORAL_TABLET | Freq: Two times a day (BID) | ORAL | Status: DC | PRN

## 2012-04-23 MED ORDER — POTASSIUM CHLORIDE CRYS ER 20 MEQ PO TBCR: 40.0000 meq | EXTENDED_RELEASE_TABLET | Freq: Once | ORAL | Status: DC

## 2012-04-23 MED ORDER — RIVAROXABAN 15 MG PO TABS: 15.0000 mg | ORAL_TABLET | Freq: Two times a day (BID) | ORAL | Status: DC

## 2012-04-23 MED ORDER — POTASSIUM CHLORIDE CRYS ER 20 MEQ PO TBCR
40.0000 meq | EXTENDED_RELEASE_TABLET | Freq: Once | ORAL | Status: AC
  Administered 2012-04-23: 40 meq via ORAL
  Filled 2012-04-23: qty 2

## 2012-04-23 NOTE — Discharge Summary (Signed)
Physician Discharge Summary  [name] ZOX:[id number] DOB: 1922-04-19 DOA: 04/22/2012  PCP: Colette Ribas, MD  Admit date: 04/22/2012 Discharge date: 04/23/2012  Time spent: 45 minutes  Recommendations for Outpatient Follow-up:   See Dr. Phillips Odor in 3 days to assess breathing status and anxiety level Please check bmet.  During this admission [name] was dehydrated and had mild hypokalemia.  She will be sent home with home health services including physical therapy, RN, social work.  Discharge Diagnoses:  Principal Problem:  *Atrial flutter by electrocardiogram Active Problems:  HYPOTHYROIDISM  DYSPEPSIA, NONULCERATIVE  DEMENTIA, HX OF  Pulmonary embolus  Shortness of breath  Hearing loss   Discharge Condition: Stable, Still with some difficulty breathing and anxiety regarding her breathing.  Diet recommendation:  Heart healthy  Filed Weights   04/22/12 1657 04/23/12 0625  Weight: 56 kg (123 lb 7.3 oz) 57.108 kg (125 lb 14.4 oz)    History of present illness:  [name] is a 79 y.o. female is very hard of hearing with dementia, thyroid disease and gerd. She was diagnosed with a pulmonary embolus on April 19, 2012, started on Xeralto in the emergency department and discharged. She lives at home alone and is frequently checked on by both her son and daughter (who are her medical power of attorney). [name] is so hard of hearing that she is unable to give much history. History is collected from her son and daughter, who tell me that she has become very weak at times. The weakness is usually worse in the morning and later in the evening. With the weakness she has episodes of shortness of breath. Also during these spells she develops anxiety which makes her breathing worse. She has had no sick contacts. And has no complaints other than her breathing. Her son reports that 3 weeks ago she did have a cough, but it resolved on its own without seeing a physician. The patient  eats well, and ambulates on her own at home by holding onto the walls and furniture. Her family is concerned that she can no longer be left at home alone. It is becoming very challenging for them to care for her. They would like to consider placement.  In the emergency department her labs are unrevealing, chest x-ray is negative, but her EKG shows atrial flutter with nonspecific T wave Changes. ER notes mention atrial flutter with variable AV block on the telemetry monitor. EKG on January 17 showed normal sinus rhythm.  [name] will be admitted for difficulty breathing with new onset atrial flutter, and possible placement.    Hospital Course:   Pulmonary Embolus  Diagnosed 1/17 on CT angio chest at [facility].  She was started on Xeralto 20 mg daily on 1/17. No hemodynamic instability. Patient, per report from daughter, is not very mobile at home and this may be the etiology of her pulmonary embolism, presumably originating from a DVT.  After admission Xeralto dosage was changed to 15 mg bid for 21 days - then recommend resumption of 20 mg daily dose.  Difficulty breathing The patient complained of pain with deep inspiration and difficulty breathing.  She became anxious about her breathing which would compound the problem.   She had no congestion, lungs were clear to auscultation, and chest xray was negative.  Her chest was not tender to palpation.  She did not demonstrate musculoskeletal pain.  It is unlikely but perhaps the pain is coming from her pulmonary embolus.  She  is already on appropriate anticoagulation.  She was given very low dose morphine and ativan in the hospital which seemed to help.  Unfortunately we will be unable to prescribe low dose morphine at discharge as she does not have 24 hour care to dispense it to her and monitor her while she is on it.  We will prescribe a short course of low-dose benzodiazepine therapy PRN to assist with the anxiety caused by difficulty  breathing.  New Onset Aflutter  Demonstrated on admission EKG.  [facility] Cardiology consulted and felt perhaps her atrial flutter was caused by physiologic stress of a pulmonary embolus and dyspnea.  By the time the patient left the emergency department she was in normal sinus rhythm.  She remained in normal sinus rhythm for the rest of her hospital stay.  Cardiology did not recommend rate control medications or any further workup.  The patient will remain anticoagulated Xeralto.  Her cardiac enzymes were cycled and found to be within normal limits. Her 2-D echo was unrevealing with an EF of 60% and no wall motion abnormalities.   TSH, free T4, BNP, and magnesium level were all within normal limits.   Hiatal Hernia / [name]  No issues with GERD during this hospitalization continue Protonix.  Hypothyroidism  Free T4 and TSH are within normal limits. Continue current dose of Synthroid Continue Synthroid   Dementia-mild.  Continue Aricept and Namenda   Procedures: 2-D echocardiogram Study Conclusions  - Left ventricle: The cavity size was normal. Wall thickness was normal. Systolic function was normal. The estimated ejection fraction was in the range of 60% to 65%. Although no diagnostic regional wall motion abnormality was identified, this possibility cannot be completely excluded on the basis of this study. Doppler parameters are consistent with abnormal left ventricular relaxation (grade 1 diastolic dysfunction). - Aortic valve: There was no stenosis. - Left atrium: The atrium was mildly dilated. - Right ventricle: The cavity size was normal. Systolic function was normal. - Pulmonary arteries: No complete TR doppler jet so unable to estimate PA systolic pressure. - Inferior vena cava: The vessel was normal in size; the respirophasic diameter changes were in the normal range (= 50%); findings are consistent with normal central venous Pressure.  Impressions:  - Normal LV size and systolic  function, EF 60-65%. Normal RV size and systolic function. No significant valvular abnormalities.    Consultations:  Cardiology  Discharge Exam: Filed Vitals:   04/22/12 1448 04/22/12 1657 04/22/12 1952 04/23/12 0625  BP: 112/72  104/67 119/73  Pulse: 61  74 60  Temp: 98 F (36.7 C)  97.7 F (36.5 C) 97.3 F (36.3 C)  TempSrc: Oral  Oral Oral  Resp: 15  16 16   Height:  5\' 5"  (1.651 m)    Weight:  56 kg (123 lb 7.3 oz)  57.108 kg (125 lb 14.4 oz)  SpO2: 100%  95% 100%    General: Awake, alert, appears well, very hard of hearing Cardiovascular: Regular rate and rhythm, no obvious murmurs rubs or gallops Respiratory: Clear to auscultation, no accessory muscle movement, patient reports it is painful to breathe deeply, no pain on palpation Abdomen: Soft, nontender, nondistended, positive bowel sounds, no masses Psychiatric: Mood appropriate, demeanor cooperative; cognitive ability difficult to assess due to severe hearing loss, however patient has mild to moderate cognitive impairment, dementia. As evidenced on standardized mini mental status exam.   Discharge Instructions      Discharge Orders    Future Orders Please Complete By Expires  DME Other see comment      Comments:   Rolling walker with 5" wheels.   Diet - low sodium heart healthy      Increase activity slowly          Medication List     As of 04/23/2012 12:25 PM    TAKE these medications         calcium-vitamin D 500-200 MG-UNIT per tablet   Commonly known as: OSCAL WITH D   Take 1 tablet by mouth daily.      donepezil 10 MG tablet   Commonly known as: ARICEPT   Take 10 mg by mouth at bedtime.      levothyroxine 50 MCG tablet   Commonly known as: SYNTHROID, LEVOTHROID   Take 50 mcg by mouth daily.      LORazepam 0.5 MG tablet   Commonly known as: ATIVAN   Take 0.5 tablets (0.25 mg total) by mouth 2 (two) times daily as needed for anxiety (for anxiety caused by difficulty breathing).       memantine 5 MG tablet   Commonly known as: NAMENDA   Take 5 mg by mouth 2 (two) times daily.      pantoprazole 20 MG tablet   Commonly known as: PROTONIX   Take 20 mg by mouth daily.      Rivaroxaban 15 MG Tabs tablet   Commonly known as: XARELTO   Take 1 tablet (15 mg total) by mouth 2 (two) times daily. Take 15 mg tablet two times daily with food for 21 days.  Then return to one 20 mg tablet once daily.        Follow-up Information    Follow up with Colette Ribas, MD. Schedule an appointment as soon as possible for a visit in 3 days.   Contact information:   [address] [place] [address] [phone]           The results of significant diagnostics from this hospitalization (including imaging, microbiology, ancillary and laboratory) are listed below for reference.    Significant Diagnostic Studies: Ct Abdomen Pelvis Wo Contrast  04/19/2012  *RADIOLOGY REPORT*  Clinical Data: Epigastric abdominal pain  CT ABDOMEN AND PELVIS WITHOUT CONTRAST  Technique:  Multidetector CT imaging of the abdomen and pelvis was performed following the standard protocol without intravenous contrast.  Comparison: 11/12/2008; 03/16/2004; abdominal MRI - 11/19/2008  Findings:  The lack of intravenous contrast limits the ability to evaluate solid abdominal organs.  Normal hepatic contour.  There are multiple punctate calcifications within the liver, likely the sequela of prior granulomatous infection.  Post cholecystectomy.  There is suggestion of mild intrahepatic biliary ductal dilatation as demonstrated on prior contrasted examinations.  No definite ascites.  Normal noncontrast appearance of the bilateral kidneys.  No renal stones.  No urinary obstruction or perinephric stranding.  Normal noncontrast appearance of the bilateral adrenal glands, pancreas and spleen.  Note is again made of a large duodenal diverticulum, likely unchanged given degree of distension.   Extensive colonic diverticulosis without evidence of diverticulitis on this noncontrast examination.  The bowel otherwise normal in course and caliber without evidence of obstruction.  The appendix is not definitely identified, however there is no inflammatory change in the right lower abdominal quadrant.  No pneumoperitoneum, pneumatosis or portal venous gas.  Scattered atherosclerotic plaque within a mildly tortuous and slightly ectatic abdominal aorta with the infrarenal abdominal aorta measuring approximately 2.0 cm in greatest transverse axial dimension (  image 34, series 2).  No definite retroperitoneal, mesenteric, pelvic or inguinal lymphadenopathy on this noncontrast examination.  Post hysterectomy.  No discrete adnexal lesion on this noncontrast examination which is further degraded secondary to streak artifact from the right total hip replacement.  Limited visualization of the lower thorax demonstrates worsening bibasilar dependent opacities, right greater than left.  No definite pleural effusion.  Cardiomegaly.  No pericardial effusion.  No acute or aggressive osseous abnormalities.  Moderate to severe multilevel focal lumbar spine degenerative change. Mild focal concavity involving the superior endplate of L1 vertebral body, new since prior examination performed 11/2008. Post left total hip replacement.  IMPRESSION:  1.  No explanation for patient's epigastric abdominal pain. Specifically, no evidence of enteric or urinary obstruction. 2.  Likely unchanged appearance of known large duodenal diverticulum. 3.  Colonic diverticulosis without evidence of diverticulitis. 4.  Post cholecystectomy.  Intrahepatic biliary ductal dilatation is suspected on this noncontrast examination as has been previously demonstrated on prior remote contrast enhanced abdominal MRI and CT examination performed in 2010. 5.  Scattered atherosclerotic plaque within a tortuous and mildly ectatic abdominal aorta. 6.  Age  indeterminate mild focal concavity involving the superior endplate of L1.  Correlation for point tenderness at this location is recommended.  7. Worsening bibasilar opacities, right greater than left, atelectasis versus infiltrate.  Further evaluation with a PA and lateral chest radiograph may be obtained as clinically indicated.   Original Report Authenticated By: Tacey Ruiz, MD    Ct Angio Chest W/cm &/or Wo Cm  04/19/2012  *RADIOLOGY REPORT*  Clinical Data: Shortness of breath  CT ANGIOGRAPHY CHEST  Technique:  Multidetector CT imaging of the chest using the standard protocol during bolus administration of intravenous contrast. Multiplanar reconstructed images including MIPs were obtained and reviewed to evaluate the vascular anatomy.  Contrast: OMNIPAQUE IOHEXOL 350 MG/ML SOLN  Comparison: 04/19/2012  Findings: Subpleural consolidation is noted in both lung bases. There is bibasilar atelectasis identified.  Heart size is mildly enlarged.  No pericardial effusion. Pulmonary artery to the basilar portion the left upper lobe contains a low attenuating filling defects consistent with acute pulmonary embolus, image number 32 of series 4.  No additional pulmonary emboli identified.  Limited imaging through the upper abdomen shows multiple granulomas within the liver.  There is multilevel degenerative disc disease within the lumbar spine.  T10 Schmorl's node deformity is identified, image 54.  IMPRESSION:  1.  Examination is positive for acute pulmonary embolus to the left upper lobe. 2.  Mild bilateral lower lobe subpleural consolidation and subsegmental atelectasis.  Critical Value/emergent results were called by telephone at the time of interpretation on 04/19/2012 at 1300 hours to Dr. Lynelle Doctor, who verbally acknowledged these results.   Original Report Authenticated By: Signa Kell, M.D.    Dg Chest Port 1 View  04/22/2012  *RADIOLOGY REPORT*  Clinical Data: Shortness of breath  PORTABLE CHEST - 1 VIEW   Comparison: 01/17/ 14  Findings: Cardiomediastinal silhouette is stable.  No acute infiltrate or pulmonary edema.  Stable left basilar atelectasis or scarring.  Mild hyperinflation again noted.  IMPRESSION: No active disease.  No significant change.   Original Report Authenticated By: Natasha Mead, M.D.    Dg Chest Portable 1 View  04/19/2012  *RADIOLOGY REPORT*  Clinical Data: Shortness of breath  PORTABLE CHEST - 1 VIEW  Comparison: Portable exam 0836 hours compared to 04/15/2012  Findings: Enlargement of cardiac silhouette with left ventricular prominence. Calcified tortuous aorta. Pulmonary vascularity  normal. Bibasilar atelectasis and question underlying emphysematous changes. No gross infiltrate, pleural effusion or pneumothorax. Diffuse osseous demineralization.  IMPRESSION: Bronchitic and probable mild emphysematous changes with bibasilar atelectasis. Mild enlargement of cardiac silhouette with left ventricular prominence.   Original Report Authenticated By: Ulyses Southward, M.D.    Dg Chest Portable 1 View  04/15/2012  *RADIOLOGY REPORT*  Clinical Data: Chest pain.  PORTABLE CHEST - 1 VIEW  Comparison: CT chest and chest radiograph 02/09/2011.  Findings: Trachea is midline.  Heart size stable.  Lungs are low in volume with mild bibasilar air space disease.  Possible small left pleural effusion.  IMPRESSION: Low lung volumes with mild bibasilar air space disease and possible small left pleural effusion.   Original Report Authenticated By: Leanna Battles, M.D.     Labs: Basic Metabolic Panel:  Lab 04/23/12 4540 04/22/12 1012 04/19/12 0907  NA 139 142 141  K 3.2* 3.5 3.7  CL 106 107 105  CO2 24 27 25   GLUCOSE 85 105* 87  BUN 17 17 22   CREATININE 0.85 1.01 1.07  CALCIUM 8.5 9.0 9.2  MG -- 2.0 --  PHOS -- -- --   Liver Function Tests:  Lab 04/19/12 0907  AST 18  ALT 9  ALKPHOS 71  BILITOT 0.5  PROT 6.5  ALBUMIN 3.3*   CBC:  Lab 04/23/12 0304 04/22/12 1012 04/19/12 0907  WBC 7.5 7.9  6.9  NEUTROABS -- 5.3 4.1  HGB 14.0 15.7* 15.4*  HCT 42.4 48.2* 47.0*  MCV 89.5 89.3 89.4  PLT 194 241 214   Cardiac Enzymes:  Lab 04/23/12 0300 04/22/12 2153 04/22/12 1519 04/22/12 1012 04/19/12 0907  CKTOTAL -- -- -- -- --  CKMB -- -- -- -- --  CKMBINDEX -- -- -- -- --  TROPONINI <0.30 <0.30 <0.30 <0.30 <0.30   BNP: BNP (last 3 results)  Basename 04/22/12 1012 04/19/12 0907  PROBNP 258.4 366.7      Signed:  Conley Canal  [facility] [phone] 04/23/2012, 12:25 PM   Crista Curb, M.D.

## 2012-04-23 NOTE — Evaluation (Signed)
Physical Therapy Evaluation Patient Details Name: [name] MRN: [id number] DOB: [birth year] Today's Date: 04/23/2012 Time: 2956-2130 PT Time Calculation (min): 23 min  PT Assessment / Plan / Recommendation Clinical Impression  Pt is a pleasant 79 year old female admitted to [facility] for positvie atrial flutter by ECG.  Acoording to notes she lives alone and has family who lives close by.  I recommend assited living or HHPT to improve LE strength and decrease fall risk.  At this time she is supervision-modified independent with all activity and had good understanding of use of a RW and generalized weakness.  Would recommend 24/7 supervision at this time for D/C.    PT Assessment  Patent does not need any further PT services    Follow Up Recommendations  Home health PT    Does the patient have the potential to tolerate intense rehabilitation      Barriers to Discharge        Equipment Recommendations  Rolling walker with 5" wheels    Recommendations for Other Services     Frequency      Precautions / Restrictions Restrictions Weight Bearing Restrictions: No   Pertinent Vitals/Pain FACES: 0/10       Mobility  Bed Mobility Bed Mobility: Supine to Sit;Sit to Supine;Scooting to HOB Supine to Sit: 5: Supervision Sit to Supine: 6: Modified independent (Device/Increase time) Scooting to [facility]: 6: Modified independent (Device/Increase time) Details for Bed Mobility Assistance: using TC and visual cueing to stand up Transfers Transfers: Sit to Stand;Stand to Sit Sit to Stand: 6: Modified independent (Device/Increase time);From chair/3-in-1;From bed Stand to Sit: 6: Modified independent (Device/Increase time);To chair/3-in-1;To bed Ambulation/Gait Ambulation/Gait Assistance: 5: Supervision Ambulation Distance (Feet): 100 Feet Assistive device: Rolling walker Ambulation/Gait Assistance Details: decreased speed, states she is SOB during gait w/o dyspnea.  Gait Pattern: Trunk  flexed;Shuffle Gait velocity: decreased  General Gait Details: using TC and visual cueing for use of RW from STS to RW.     Shoulder Instructions     Exercises     PT Diagnosis:    PT Problem List:   PT Treatment Interventions:     PT Goals    Visit Information  Last PT Received On: 04/23/12    Subjective Data  Subjective: Pt states she would like to walk.    Prior Functioning  Home Living Lives With: Alone Available Help at Discharge: Family Type of Home: House Home Access:  (unable to obtain secondary to pt is [facility] Additional Comments: When I present RW to her, she says she walks by herself and does not need a RW.   Prior Function Level of Independence: Independent Comments: Unable to obtain information about her house secondary to [facility] Communication Communication: HOH    Cognition  Arousal/Alertness: Awake/alert Orientation Level: Disoriented X4;Place Cognition - Other Comments: she could not understand when I asked her about what day or to state her name. Did understand when I asked where she was and stated "I don't know where I am."    Extremity/Trunk Assessment Right Lower Extremity Assessment RLE ROM/Strength/Tone: [facility] for tasks assessed Left Lower Extremity Assessment LLE ROM/Strength/Tone: [facility] for tasks assessed Trunk Assessment Trunk Assessment: Kyphotic   Balance Balance Balance Assessed: Yes Static Sitting Balance Static Sitting - Comment/# of Minutes: able to complete independent pericare Static Standing Balance Static Standing - Comment/# of Minutes: able to wash hands at sink w/supervision.   End of Session PT - End of Session Equipment Utilized During Treatment: Gait belt Activity  Tolerance: Patient limited by fatigue Patient left: in bed  GP Functional Limitation: Mobility: Walking and moving around Mobility: Walking and Moving Around Current Status [phone]): At least 1 percent but less than 20 percent impaired, limited or restricted Mobility:  Walking and Moving Around Goal Status [phone]): At least 1 percent but less than 20 percent impaired, limited or restricted Mobility: Walking and Moving Around Discharge Status [phone]): At least 1 percent but less than 20 percent impaired, limited or restricted   [name] 04/23/2012, 11:00 AM

## 2012-04-23 NOTE — Progress Notes (Signed)
Patient received discharge instructions along with follow up appointments and prescriptions. Patient's daughter verbalized  Understanding of all instructions. Patient was escorted via wheelchair to vehicle. Patient discharged to home in stable condition.

## 2012-04-23 NOTE — Care Management Note (Signed)
    Page 1 of 2   04/23/2012     12:51:31 PM   CARE MANAGEMENT NOTE 04/23/2012  Patient:  [name], [name]   Account Number:  [id number]  Date Initiated:  04/23/2012  Documentation initiated by:  Rosemary Holms  Subjective/Objective Assessment:   Pt admitted into observation. PTA lives at home alone. Daughter at bedside and discussed HH needs and RW. [facility], Representive contacted for [facility] and DME     Action/Plan:   Anticipated DC Date:  04/23/2012   Anticipated DC Plan:  HOME W HOME HEALTH SERVICES      DC Planning Services  CM consult      Choice offered to / List presented to:     DME arranged  WALKER - ROLLING      DME agency  [facility]     HH arranged  HH-1 RN  HH-10 DISEASE MANAGEMENT  HH-2 PT  HH-6 SOCIAL WORKER      HH agency  [facility]   Status of service:  Completed, signed off Medicare Important Message given?   (If response is "NO", the following Medicare IM given date fields will be blank) Date Medicare IM given:   Date Additional Medicare IM given:    Discharge Disposition:  HOME W HOME HEALTH SERVICES  Per UR Regulation:    If discussed at Long Length of Stay Meetings, dates discussed:    Comments:  04/24/11 Rosemary Holms RN BSN CM Mary with [facility] will be here  pick up orders and paperwork around 2pm

## 2012-04-23 NOTE — Progress Notes (Signed)
   Primary cardiologist: Dr. Valera Castle  SUBJECTIVE:No complaints. Hard of hearing. Mildly confused.  LABS: Basic Metabolic Panel:  Basename 04/23/12 0304 04/22/12 1012  NA 139 142  K 3.2* 3.5  CL 106 107  CO2 24 27  GLUCOSE 85 105*  BUN 17 17  CREATININE 0.85 1.01  CALCIUM 8.5 9.0  MG -- 2.0  PHOS -- --   CBC:  Basename 04/23/12 0304 04/22/12 1012  WBC 7.5 7.9  NEUTROABS -- 5.3  HGB 14.0 15.7*  HCT 42.4 48.2*  MCV 89.5 89.3  PLT 194 241   Cardiac Enzymes:  Basename 04/23/12 0300 04/22/12 2153 04/22/12 1519  CKTOTAL -- -- --  CKMB -- -- --  CKMBINDEX -- -- --  TROPONINI <0.30 <0.30 <0.30   Thyroid Function Tests:  Basename 04/22/12 1012  TSH 1.432  T4TOTAL --  T3FREE --  THYROIDAB --    RADIOLOGY: Dg Chest Port 1 View  04/22/2012  *RADIOLOGY REPORT*  Clinical Data: Shortness of breath  PORTABLE CHEST - 1 VIEW  Comparison: 01/17/ 14  Findings: Cardiomediastinal silhouette is stable.  No acute infiltrate or pulmonary edema.  Stable left basilar atelectasis or scarring.  Mild hyperinflation again noted.  IMPRESSION: No active disease.  No significant change.   Original Report Authenticated By: Natasha Mead, M.D.     PHYSICAL EXAM BP 119/73  Pulse 60  Temp 97.3 F (36.3 C) (Oral)  Resp 16  Ht 5\' 5"  (1.651 m)  Wt 125 lb 14.4 oz (57.108 kg)  BMI 20.95 kg/m2  SpO2 100% General: No acute distress Lungs: Clear bilaterally to auscultation and percussion. Heart: HRRR S1 S2, No MRG .  Pulses are 2+ & equal. Extremities: No clubbing, cyanosis or edema.  DP +1   TELEMETRY: Reviewed telemetry pt in NSR rates in the 70's  ASSESSMENT AND PLAN:  1. Atrial fibrillation: Has converted to NSR and remained so for 18 hours. No further cardiac recommendations at this time. No rate control medications is recommended. Thought to be related to physiologic stress from acute PE diagnosed 4 days ago. She continues on Xarelto for ease of administration and difficulty with  frequent labs. Can go home from our standpoint.  2. Acute LUL PE: Recently diagnosed. On Xarelto.  3. Overall Deconditioning and frail state: Case management addressing family concerns for home care vs SNF.   4. Hypothyroidism: Labs unremarkable.   Bettey Mare. Lyman Bishop NP [facility] 04/23/2012, 8:48 AM  Attending note:  Recent consultation note by Dr. Daleen Squibb reviewed and above note by Ms. Lawrence NP modified.Marland Kitchen Heart rhythm has been stable, sinus by telemetry. At this point suggest continued anticoagulation for treatment of acute pulmonary embolus, no other specific cardiac testing planned. No rate control medications have been added. We will sign off.  Jonelle Sidle, M.D., F.A.C.C.

## 2012-04-23 NOTE — Clinical Social Work Psychosocial (Signed)
Clinical Social Work Department BRIEF PSYCHOSOCIAL ASSESSMENT 04/23/2012  Patient:  [name], [name]     Account Number:  [id number]     Admit date:  04/22/2012  Clinical Social Worker:  Nancie Neas  Date/Time:  04/23/2012 11:30 AM  Referred by:  Physician  Date Referred:  04/23/2012 Referred for  SNF Placement   Other Referral:   Interview type:  Patient Other interview type:    PSYCHOSOCIAL DATA Living Status:  ALONE Admitted from facility:   Level of care:   Primary support name:  [name] Primary support relationship to patient:  CHILD, ADULT Degree of support available:   supportive    CURRENT CONCERNS Current Concerns  Post-Acute Placement   Other Concerns:    SOCIAL WORK ASSESSMENT / PLAN CSW met with pt and [name] at bedside. Pt alert. Difficult to assess mental status due to the fact that pt is very hard of hearing. She has been living alone. [name] listed as HCPOA on chart. [name] reports pt has a family friend that they pay who assists pt from 11-3 daily. This individual also lives across the street if needed. Pt's son lives nearby and [name] lives in [place]. Pt does minimal cooking and no longer drives. She ambulates independently and has [facility] which she has used in the past. Pt evaluated by PT today and recommendation is for ALF or home health with supervision. [name] is aware of ALF and that it is private pay. It has been pt's desire to remain at home as long as possible and [name] would like for her to return home from hospital. CSW discussed ALF options if family would like to pursue this in the future and provided ALF list for [facility]. [name] plans to talk to sitter to see about increasing number of hours in the home every day. They are open to home health PT and SW. CSW notified PA.   Assessment/plan status:  Referral to [facility] Other assessment/ plan:   Information/referral to community resources:   CM for home  health  ALF list    PATIENT'S/FAMILY'S RESPONSE TO PLAN OF CARE: Pt and daughter agreeable to return home at d/c. CM and PA aware of above information. CSW signing off.        [name], [place] [phone]

## 2012-04-23 NOTE — Progress Notes (Signed)
UR Chart Review Completed  

## 2013-09-08 ENCOUNTER — Encounter (HOSPITAL_COMMUNITY): Payer: Self-pay | Admitting: Emergency Medicine

## 2013-09-08 ENCOUNTER — Emergency Department (HOSPITAL_COMMUNITY)
Admission: EM | Admit: 2013-09-08 | Discharge: 2013-09-08 | Disposition: A | Discharge status: Home / Self Care | Payer: Medicare Other | Attending: Emergency Medicine | Admitting: Emergency Medicine

## 2013-09-08 ENCOUNTER — Emergency Department (HOSPITAL_COMMUNITY): Payer: Medicare Other

## 2013-09-08 DIAGNOSIS — Z7901 Long term (current) use of anticoagulants: Secondary | ICD-10-CM | POA: Insufficient documentation

## 2013-09-08 DIAGNOSIS — Y9301 Activity, walking, marching and hiking: Secondary | ICD-10-CM | POA: Insufficient documentation

## 2013-09-08 DIAGNOSIS — W010XXA Fall on same level from slipping, tripping and stumbling without subsequent striking against object, initial encounter: Secondary | ICD-10-CM | POA: Insufficient documentation

## 2013-09-08 DIAGNOSIS — K219 Gastro-esophageal reflux disease without esophagitis: Secondary | ICD-10-CM | POA: Insufficient documentation

## 2013-09-08 DIAGNOSIS — Z043 Encounter for examination and observation following other accident: Secondary | ICD-10-CM | POA: Insufficient documentation

## 2013-09-08 DIAGNOSIS — W19XXXA Unspecified fall, initial encounter: Secondary | ICD-10-CM

## 2013-09-08 DIAGNOSIS — F0390 Unspecified dementia without behavioral disturbance: Secondary | ICD-10-CM | POA: Insufficient documentation

## 2013-09-08 DIAGNOSIS — E079 Disorder of thyroid, unspecified: Secondary | ICD-10-CM | POA: Insufficient documentation

## 2013-09-08 DIAGNOSIS — Z79899 Other long term (current) drug therapy: Secondary | ICD-10-CM | POA: Insufficient documentation

## 2013-09-08 DIAGNOSIS — Y9289 Other specified places as the place of occurrence of the external cause: Secondary | ICD-10-CM | POA: Insufficient documentation

## 2013-09-08 HISTORY — DX: Other pulmonary embolism without acute cor pulmonale: I26.99

## 2013-09-08 LAB — URINALYSIS, ROUTINE W REFLEX MICROSCOPIC
Bilirubin Urine: NEGATIVE
GLUCOSE, UA: NEGATIVE mg/dL
HGB URINE DIPSTICK: NEGATIVE
KETONES UR: NEGATIVE mg/dL
Leukocytes, UA: NEGATIVE
Nitrite: NEGATIVE
PH: 6 (ref 5.0–8.0)
Protein, ur: NEGATIVE mg/dL
Specific Gravity, Urine: >1.03 — ABNORMAL HIGH (ref 1.005–1.030)
Urobilinogen, UA: 1 mg/dL (ref 0.0–1.0)

## 2013-09-08 LAB — CBC WITH DIFFERENTIAL/PLATELET
Basophils Absolute: 0 10*3/uL (ref 0.0–0.1)
Basophils Relative: 0 % (ref 0–1)
Eosinophils Absolute: 0 10*3/uL (ref 0.0–0.7)
Eosinophils Relative: 0 % (ref 0–5)
HEMATOCRIT: 45.9 % (ref 36.0–46.0)
Hemoglobin: 14.9 g/dL (ref 12.0–15.0)
LYMPHS ABS: 1.4 10*3/uL (ref 0.7–4.0)
LYMPHS PCT: 12 % (ref 12–46)
MCH: 28.7 pg (ref 26.0–34.0)
MCHC: 32.5 g/dL (ref 30.0–36.0)
MCV: 88.3 fL (ref 78.0–100.0)
Monocytes Absolute: 0.9 10*3/uL (ref 0.1–1.0)
Monocytes Relative: 8 % (ref 3–12)
Neutro Abs: 9.8 10*3/uL — ABNORMAL HIGH (ref 1.7–7.7)
Neutrophils Relative %: 80 % — ABNORMAL HIGH (ref 43–77)
Platelets: 147 10*3/uL — ABNORMAL LOW (ref 150–400)
RBC: 5.2 MIL/uL — ABNORMAL HIGH (ref 3.87–5.11)
RDW: 14.6 % (ref 11.5–15.5)
WBC: 12.1 10*3/uL — AB (ref 4.0–10.5)

## 2013-09-08 LAB — BASIC METABOLIC PANEL
BUN: 28 mg/dL — ABNORMAL HIGH (ref 6–23)
CALCIUM: 8.7 mg/dL (ref 8.4–10.5)
CO2: 25 mEq/L (ref 19–32)
Chloride: 103 mEq/L (ref 96–112)
Creatinine, Ser: 1.16 mg/dL — ABNORMAL HIGH (ref 0.50–1.10)
GFR, EST AFRICAN AMERICAN: 47 mL/min — AB (ref >90)
GFR, EST NON AFRICAN AMERICAN: 40 mL/min — AB (ref >90)
Glucose, Bld: 102 mg/dL — ABNORMAL HIGH (ref 70–99)
POTASSIUM: 3.8 meq/L (ref 3.7–5.3)
SODIUM: 141 meq/L (ref 137–147)

## 2013-09-08 NOTE — ED Provider Notes (Signed)
CSN: [id number]     Arrival date & time    History   First MD Initiated Contact with Patient 09/08/13 1023     Chief Complaint  Patient presents with  . Fall   Patient is a 80 y.o. female presenting with fall. The history is provided by the patient, a caregiver and a relative. No language interpreter was used.  Fall This is a new problem. The current episode started 1 to 2 hours ago. Nothing aggravates the symptoms. Nothing relieves the symptoms. She has tried nothing for the symptoms.   This chart was scribed for Tiffany Gaskinsonald W Manroop Jakubowicz, MD by Andrew Auaven Small, ED Scribe. This patient was seen in room APA01/APA01 and the patient's care was started at 10:26 AM.  HPI Comments:  This is a level 5 caveat [name] is a 80 y.o. female who present to the Emergency Department complaining of fall x 2 hours ago. Per caregiver pt slid to the floor when walking to the bathroom. After fall pt stayed on the floor. Caregiver was there at that time and reports pt has been laying in bed since the fall which is not normal. Daughter reports pt was not herself during the weekend and had a fall a couple days ago. No f other falls have been reported.  Daughter denies changes in pt medicine. Pt denies pain. She denies syncope. Pt has h/o of blood clots in lungs in which she takes xarelto. Pt received dose this morning.   Daughter wants pt in assistant living   Past Medical History  Diagnosis Date  . Hiatal hernia   . Dementia   . Thyroid disease   . GERD (gastroesophageal reflux disease)   . Diverticulosis of colon (without mention of hemorrhage)     Diverticulosis   Past Surgical History  Procedure Laterality Date  . Cholecystectomy     No family history on file. History  Substance Use Topics  . Smoking status: Never Smoker   . Smokeless tobacco: Not on file  . Alcohol Use: No   OB History   Grav Para Term Preterm Abortions TAB SAB Ect Mult Living                 Review of Systems  Unable to perform  ROS: Dementia   Allergies  Review of patient's allergies indicates no known allergies.  Home Medications   Prior to Admission medications   Medication Sig Start Date End Date Taking? Authorizing Provider  calcium-vitamin D (OSCAL WITH D) 500-200 MG-UNIT per tablet Take 1 tablet by mouth daily.      Historical Provider, MD  donepezil (ARICEPT) 10 MG tablet Take 10 mg by mouth at bedtime.     Historical Provider, MD  levothyroxine (SYNTHROID, LEVOTHROID) 50 MCG tablet Take 50 mcg by mouth daily.     Historical Provider, MD  LORazepam (ATIVAN) 0.5 MG tablet Take 0.5 tablets (0.25 mg total) by mouth 2 (two) times daily as needed for anxiety (for anxiety caused by difficulty breathing). 04/23/12   Tora KindredMarianne L York, PA-C  memantine (NAMENDA) 5 MG tablet Take 5 mg by mouth 2 (two) times daily.     Historical Provider, MD  pantoprazole (PROTONIX) 20 MG tablet Take 20 mg by mouth daily.    Historical Provider, MD  rivaroxaban (XARELTO) 15 MG TABS tablet Take 1 tablet (15 mg total) by mouth 2 (two) times daily. Take 15 mg tablet two times daily with food for 21 days.  Then return to one 20  mg tablet once daily. 04/23/12   Marianne L York, PA-C   BP 133/81  Pulse 75  Resp 23  Wt 125 lb (56.7 kg)  SpO2 94% Physical Exam CONSTITUTIONAL: Well developed/well nourished HEAD: Normocephalic/atraumatic EYES: EOMI/PERRL ENMT: Mucous membranes moist NECK: supple no meningeal signs SPINE:entire spine nontender CV: S1/S2 noted, no murmurs/rubs/gallops noted LUNGS: Lungs are clear to auscultation bilaterally, no apparent distress ABDOMEN: soft, nontender, no rebound or guarding GU:no cva tenderness NEURO: Pt is awake/alert, moves all extremitiesx4, pt is at baseline confusion  EXTREMITIES: pulses normal, full ROM, no deformity not tenderness noted to extremities  SKIN: warm, color normal PSYCH: no abnormalities of mood noted  ED Course  Procedures  DIAGNOSTIC STUDIES: Oxygen Saturation is 94% on RA,  normal by my interpretation.    COORDINATION OF CARE: 10:33 AM-Discussed treatment plan which includes CBC  with pt at bedside and pt agreed to plan.   1:41 PM Pt at baseline She is in no distress D/w daughter who is MPOA She did not want any further testing/CT head at this time She has no signs of trauma, she is ambulatory and eating a meal She has seen SW and will f/u for any outpatient needs/placement as an outpatient  Labs Review Labs Reviewed  BASIC METABOLIC PANEL - Abnormal; Notable for the following:    Glucose, Bld 102 (*)    BUN 28 (*)    Creatinine, Ser 1.16 (*)    GFR calc non Af Amer 40 (*)    GFR calc Af Amer 47 (*)    All other components within normal limits  CBC WITH DIFFERENTIAL - Abnormal; Notable for the following:    WBC 12.1 (*)    RBC 5.20 (*)    Platelets 147 (*)    Neutrophils Relative % 80 (*)    Neutro Abs 9.8 (*)    All other components within normal limits  URINALYSIS, ROUTINE W REFLEX MICROSCOPIC - Abnormal; Notable for the following:    Specific Gravity, Urine >1.030 (*)    All other components within normal limits    Imaging Review Dg Chest Portable 1 View  09/08/2013   CLINICAL DATA:  Recent traumatic injury with pain  EXAM: PORTABLE CHEST - 1 VIEW  COMPARISON:  04/22/2012  FINDINGS: Cardiac shadow is mildly enlarged. The lungs are well aerated bilaterally. Diffuse interstitial changes are seen without focal confluent infiltrate. No sizable effusion is seen. No bony abnormality is noted.  IMPRESSION: No acute abnormality is noted.   Electronically Signed   By: Alcide Clever M.D.   On: 09/08/2013 10:57     EKG Interpretation   Date/Time:  Monday September 08 2013 10:36:29 EDT Ventricular Rate:  82 PR Interval:  146 QRS Duration: 88 QT Interval:  398 QTC Calculation: 465 R Axis:   -10 Text Interpretation:  Sinus rhythm Probable left atrial enlargement t wave  changes in anterior leads improved artifact noted Confirmed by Bebe Shaggy   MD, Dorinda Hill  [phone]) on 09/08/2013 10:46:42 AM      MDM   Final diagnoses:  Fall    Nursing notes including past medical history and social history reviewed and considered in documentation xrays reviewed and considered Labs/vital reviewed and considered   I personally performed the services described in this documentation, which was scribed in my presence. The recorded information has been reviewed and is accurate.     Tiffany Gaskins, MD 09/08/13 1341

## 2013-09-08 NOTE — ED Notes (Signed)
Pt ambulated to chair in room with assistance. Eating meal tray at present without difficulty.

## 2013-09-08 NOTE — ED Notes (Signed)
Patient with no complaints at this time. Respirations even and unlabored. Skin warm/dry. Discharge instructions reviewed with patient's daughter at this time. Patient's daughter given opportunity to voice concerns/ask questions. IV removed per policy and band-aid applied to site. Patient discharged at this time and left Emergency Department with steady gait.

## 2013-09-08 NOTE — Discharge Instructions (Signed)

## 2013-09-08 NOTE — ED Notes (Signed)
Caregiver at bedside. Reports fall was a slide down the wall to sitting position. Did not hit head. Patient not complaining of any pain. Normal mental status is oriented to person only with intermittent orientation to day/year/situation.

## 2013-09-08 NOTE — Care Management Note (Signed)
Pt is from home with a caregiver for 7hrs a day, but alone the rest of the day, except when family checks on her. Per daughter, patient's dementia has gotten to the point that the patient is no longer able to care for herself. She has been talking with [facility], ALF, about patient coming there to live. She spoke with them this morning, telling them she was bringing the patient to the  ED. They suggested she might be able to transfer the patient directly to [facility] from the hospital. E-mail sent to K. Stultz, and A. Tomasa Rand, CSWs concerning this, and one of them will see the patient/ daughter

## 2013-09-08 NOTE — ED Notes (Addendum)
Patient arrives via EMS from home with c/o fall this morning. Caregiver reports patient normally gets up and showers by self. This morning patient had fall in hallway witnessed by home health care provider. Caregiver states patient today has been less active than normal and could not preform ADLs per normal today. Pt is alert, follows commands. H/o dementia and oriented to person only. Unsure at present what baseline mental status is as no caregiver is present during triage. Pupils reactive but pinpoint.

## 2013-09-08 NOTE — Clinical Social Work Note (Signed)
Clinical Social Work Department BRIEF PSYCHOSOCIAL ASSESSMENT 09/08/2013  Patient:  [name], [name]     Account Number:  [id number]     Admit date:  09/08/2013  Clinical Social Worker:  Edwyna Shell, [place]  Date/Time:  09/08/2013 12:15 PM  Referred by:  Care Management  Date Referred:  09/08/2013 Referred for  ALF Placement   Other Referral:   Interview type:  Family Other interview type:   Spoke w daughter and caregiver Lysle Morales) at bedside    PSYCHOSOCIAL DATA Living Status:  ALONE Admitted from facility:   Level of care:   Primary support name:  Lysle Morales, caregiver; Lala Lund (daughter) Primary support relationship to patient:  CHILD, ADULT Degree of support available:   Caregiver 7 hours/day.  Daughter supportive and involved.    CURRENT CONCERNS Current Concerns  Post-Acute Placement   Other Concerns:    SOCIAL WORK ASSESSMENT / PLAN CSW met w patient's daughter and caregiver at bedside in ED.  Patient oriented to self only, has dementia.  Patient lives alone, has caregiver 8:30 - 2, then 6:30 - 8 daily. Caregiver assists patient w bathing, dressing, household chores. Patient has good appetite and does not require prompts to eat.  Patient tis not incontinent other than occasional "accidents."   Patient is put to bed by caregiver at 8:30, is alone at night but can get out of bed by herself if needed to use restroom.  Family is comfortable w current arrangements and want patient to remain home as long as possible.  They have considered ALF placement at [facility], but do not want to disrupt current arrangements.  Patient likes caregiver, daughter monitors situation from her home in [place] and makes frequent visits to mother.  Family does not desire any assistance w ALF placement at this time.  Has PCP, Hilma Favors MD, and understand they can need to work through PCP for ALF placement.  CSW signing off as no SW needs identified.   Assessment/plan status:   Psychosocial Support/Ongoing Assessment of Needs Other assessment/ plan:   Information/referral to community resources:   Did not provide ALF list, family does not want placement at this time.    PATIENT'S/FAMILY'S RESPONSE TO PLAN OF CARE: Family and caregiver supportive and involved, patient pleasant but oriented to self only.  Family aware of all resources needed to maintain patient at home, aware of how to access ALF if needed in future.       Edwyna Shell, LCSW Clinical Social Worker [phone])

## 2013-09-11 ENCOUNTER — Emergency Department (HOSPITAL_COMMUNITY): Payer: Medicare Other

## 2013-09-11 ENCOUNTER — Encounter (HOSPITAL_COMMUNITY): Payer: Self-pay | Admitting: Emergency Medicine

## 2013-09-11 ENCOUNTER — Emergency Department (HOSPITAL_COMMUNITY)
Admission: EM | Admit: 2013-09-11 | Discharge: 2013-09-11 | Disposition: A | Discharge status: Home / Self Care | Payer: Medicare Other | Attending: Emergency Medicine | Admitting: Emergency Medicine

## 2013-09-11 DIAGNOSIS — E079 Disorder of thyroid, unspecified: Secondary | ICD-10-CM | POA: Insufficient documentation

## 2013-09-11 DIAGNOSIS — L989 Disorder of the skin and subcutaneous tissue, unspecified: Secondary | ICD-10-CM | POA: Insufficient documentation

## 2013-09-11 DIAGNOSIS — Z79899 Other long term (current) drug therapy: Secondary | ICD-10-CM | POA: Insufficient documentation

## 2013-09-11 DIAGNOSIS — Z86711 Personal history of pulmonary embolism: Secondary | ICD-10-CM | POA: Insufficient documentation

## 2013-09-11 DIAGNOSIS — F0390 Unspecified dementia without behavioral disturbance: Secondary | ICD-10-CM | POA: Insufficient documentation

## 2013-09-11 DIAGNOSIS — K219 Gastro-esophageal reflux disease without esophagitis: Secondary | ICD-10-CM | POA: Insufficient documentation

## 2013-09-11 HISTORY — DX: Anxiety disorder, unspecified: F41.9

## 2013-09-11 LAB — CBC WITH DIFFERENTIAL/PLATELET
BASOS ABS: 0 10*3/uL (ref 0.0–0.1)
BASOS PCT: 0 % (ref 0–1)
EOS PCT: 1 % (ref 0–5)
Eosinophils Absolute: 0.1 10*3/uL (ref 0.0–0.7)
HCT: 44.6 % (ref 36.0–46.0)
Hemoglobin: 14.8 g/dL (ref 12.0–15.0)
LYMPHS PCT: 18 % (ref 12–46)
Lymphs Abs: 1.4 10*3/uL (ref 0.7–4.0)
MCH: 29 pg (ref 26.0–34.0)
MCHC: 33.2 g/dL (ref 30.0–36.0)
MCV: 87.5 fL (ref 78.0–100.0)
Monocytes Absolute: 0.7 10*3/uL (ref 0.1–1.0)
Monocytes Relative: 9 % (ref 3–12)
Neutro Abs: 5.6 10*3/uL (ref 1.7–7.7)
Neutrophils Relative %: 72 % (ref 43–77)
PLATELETS: 169 10*3/uL (ref 150–400)
RBC: 5.1 MIL/uL (ref 3.87–5.11)
RDW: 13.9 % (ref 11.5–15.5)
WBC: 7.8 10*3/uL (ref 4.0–10.5)

## 2013-09-11 LAB — URINALYSIS, ROUTINE W REFLEX MICROSCOPIC
Bilirubin Urine: NEGATIVE
Glucose, UA: NEGATIVE mg/dL
Hgb urine dipstick: NEGATIVE
KETONES UR: NEGATIVE mg/dL
LEUKOCYTES UA: NEGATIVE
Nitrite: NEGATIVE
PROTEIN: NEGATIVE mg/dL
Specific Gravity, Urine: 1.01 (ref 1.005–1.030)
Urobilinogen, UA: 0.2 mg/dL (ref 0.0–1.0)
pH: 5.5 (ref 5.0–8.0)

## 2013-09-11 LAB — BASIC METABOLIC PANEL
BUN: 23 mg/dL (ref 6–23)
CALCIUM: 9 mg/dL (ref 8.4–10.5)
CO2: 25 mEq/L (ref 19–32)
CREATININE: 1.07 mg/dL (ref 0.50–1.10)
Chloride: 102 mEq/L (ref 96–112)
GFR calc Af Amer: 52 mL/min — ABNORMAL LOW (ref >90)
GFR calc non Af Amer: 45 mL/min — ABNORMAL LOW (ref >90)
Glucose, Bld: 115 mg/dL — ABNORMAL HIGH (ref 70–99)
Potassium: 4.1 mEq/L (ref 3.7–5.3)
Sodium: 140 mEq/L (ref 137–147)

## 2013-09-11 MED ORDER — LORAZEPAM 0.5 MG PO TABS: ORAL_TABLET | ORAL | Status: DC

## 2013-09-11 MED ORDER — LORAZEPAM 1 MG PO TABS
0.5000 mg | ORAL_TABLET | Freq: Once | ORAL | Status: AC
  Administered 2013-09-11: 0.5 mg via ORAL
  Filled 2013-09-11: qty 1

## 2013-09-11 NOTE — ED Notes (Signed)
Per EMS pt has Alzheimer, family needs educating. Pt was complaining of belly hurting. After assessment, pt pants were really tight and redness around waist, tender to touch.

## 2013-09-11 NOTE — ED Provider Notes (Signed)
CSN: [id number]     Arrival date & time 09/11/13  1946 History   First MD Initiated Contact with Patient 09/11/13 2020     Chief Complaint  Patient presents with  . Skin irritation    . Hallucinations    per family      The history is provided by a caregiver and a relative. The history is limited by the condition of the patient (Hx dementia).  Pt was seen at 2035. Per pt's family: c/o gradual onset and persistence of constant confusion for the past several years, worse over the past several months. Pt's family states pt will not sleep at night and "hallucinates" during the day. Pt's family states she becomes "very anxious" during the daytime and is requesting "something to help with her nerves." Pt's family has been attempting to place pt at [facility] for her dementia. Pt has significant hx of dementia and denies any complaints currently. No reported new falls, no fevers, no CP/SOB, no cough, no abd pain, no N/V/D.    Past Medical History  Diagnosis Date  . Hiatal hernia   . Dementia   . Thyroid disease   . GERD (gastroesophageal reflux disease)   . Diverticulosis of colon (without mention of hemorrhage)     Diverticulosis  . Pulmonary emboli 04/2012  . Anxiety    Past Surgical History  Procedure Laterality Date  . Cholecystectomy      History  Substance Use Topics  . Smoking status: Never Smoker   . Smokeless tobacco: Not on file  . Alcohol Use: No    Review of Systems  Unable to perform ROS: Dementia    Allergies  Review of patient's allergies indicates no known allergies.  Home Medications   Prior to Admission medications   Medication Sig Start Date End Date Taking? Authorizing Provider  donepezil (ARICEPT) 10 MG tablet Take 10 mg by mouth at bedtime.    Yes Historical Provider, MD  furosemide (LASIX) 40 MG tablet Take 40 mg by mouth daily as needed for fluid.   Yes Historical Provider, MD  levothyroxine (SYNTHROID, LEVOTHROID) 50 MCG tablet Take 50 mcg by  mouth daily.    Yes Historical Provider, MD  loperamide (IMODIUM A-D) 2 MG tablet Take 2 mg by mouth at bedtime.   Yes Historical Provider, MD  memantine (NAMENDA) 10 MG tablet Take 10 mg by mouth 2 (two) times daily.   Yes Historical Provider, MD  pantoprazole (PROTONIX) 20 MG tablet Take 20 mg by mouth daily.   Yes Historical Provider, MD  rivaroxaban (XARELTO) 20 MG TABS tablet Take 20 mg by mouth daily with supper.   Yes Historical Provider, MD   BP 145/90  Pulse 73  Temp(Src) 97.5 F (36.4 C) (Oral)  Resp 18  SpO2 95% Physical Exam 2040: Physical examination:  Nursing notes reviewed; Vital signs and O2 SAT reviewed;  Constitutional: Thin, frail. Well hydrated, In no acute distress; Head:  Normocephalic, atraumatic; Eyes: EOMI, PERRL, No scleral icterus; ENMT: Mouth and pharynx normal, Mucous membranes moist; Neck: Supple, Full range of motion, No lymphadenopathy; Cardiovascular: Regular rate and rhythm, No gallop; Respiratory: Breath sounds clear & equal bilaterally, No wheezes. Normal respiratory effort/excursion; Chest: Nontender, Movement normal; Abdomen: Soft, Nontender, Nondistended, Normal bowel sounds; Genitourinary: No CVA tenderness; Extremities: Pulses normal, No tenderness, +1 pedal edema bilaterally. No calf asymmetry.; Neuro: Awake, alert, confused re: time, place, events per hx dementia. Major CN grossly intact. No facial droop. Speech clear. Moves all extremities spontaneously without  apparent gross focal motor deficits.; Skin: Color normal, Warm, Dry.    ED Course  Procedures   MDM  MDM Reviewed: previous chart, nursing note and vitals Reviewed previous: labs Interpretation: labs    Results for orders placed during the hospital encounter of 09/11/13  BASIC METABOLIC PANEL      Result Value Ref Range   Sodium 140  137 - 147 mEq/L   Potassium 4.1  3.7 - 5.3 mEq/L   Chloride 102  96 - 112 mEq/L   CO2 25  19 - 32 mEq/L   Glucose, Bld 115 (*) 70 - 99 mg/dL   BUN  23  6 - 23 mg/dL   Creatinine, Ser 4.781.07  0.50 - 1.10 mg/dL   Calcium 9.0  8.4 - 29.510.5 mg/dL   GFR calc non Af Amer 45 (*) >90 mL/min   GFR calc Af Amer 52 (*) >90 mL/min  CBC WITH DIFFERENTIAL      Result Value Ref Range   WBC 7.8  4.0 - 10.5 K/uL   RBC 5.10  3.87 - 5.11 MIL/uL   Hemoglobin 14.8  12.0 - 15.0 g/dL   HCT 62.144.6  30.836.0 - 65.746.0 %   MCV 87.5  78.0 - 100.0 fL   MCH 29.0  26.0 - 34.0 pg   MCHC 33.2  30.0 - 36.0 g/dL   RDW 84.613.9  96.211.5 - 95.215.5 %   Platelets 169  150 - 400 K/uL   Neutrophils Relative % 72  43 - 77 %   Neutro Abs 5.6  1.7 - 7.7 K/uL   Lymphocytes Relative 18  12 - 46 %   Lymphs Abs 1.4  0.7 - 4.0 K/uL   Monocytes Relative 9  3 - 12 %   Monocytes Absolute 0.7  0.1 - 1.0 K/uL   Eosinophils Relative 1  0 - 5 %   Eosinophils Absolute 0.1  0.0 - 0.7 K/uL   Basophils Relative 0  0 - 1 %   Basophils Absolute 0.0  0.0 - 0.1 K/uL    2200:  UA/UC and CXR pending. Pt will likely be able to be discharged. Pt has previously received ativan 0.25mg  PO BID prn prescription for anxiety in 04/2012. Family does not recall "if that worked but we'll take it." Family aware will rx short course only; verb  understanding. Sign out to Dr. Estell HarpinZammit.      Laray AngerKathleen M Ria Redcay, DO 09/11/13 2203

## 2013-09-11 NOTE — Discharge Instructions (Signed)
Follow up with your family md  °

## 2013-09-11 NOTE — ED Notes (Signed)
Family at the bedside, Son concerned with other issues, states pt has been halluc ation during the day. Will talk with [facility] tomorrow for placement

## 2013-09-11 NOTE — ED Notes (Signed)
Patient given discharge instruction, verbalized understand.  Patient in wheelchair out of the department.  

## 2013-09-13 LAB — URINE CULTURE
Colony Count: NO GROWTH
Culture: NO GROWTH

## 2013-11-24 ENCOUNTER — Encounter (HOSPITAL_COMMUNITY): Payer: Self-pay | Admitting: Emergency Medicine

## 2013-11-24 ENCOUNTER — Emergency Department (HOSPITAL_COMMUNITY): Payer: Medicare Other

## 2013-11-24 ENCOUNTER — Emergency Department (HOSPITAL_COMMUNITY)
Admission: EM | Admit: 2013-11-24 | Discharge: 2013-11-24 | Disposition: A | Discharge status: Skilled Nursing Facility | Payer: Medicare Other | Attending: Emergency Medicine | Admitting: Emergency Medicine

## 2013-11-24 DIAGNOSIS — Z79899 Other long term (current) drug therapy: Secondary | ICD-10-CM | POA: Insufficient documentation

## 2013-11-24 DIAGNOSIS — R4182 Altered mental status, unspecified: Secondary | ICD-10-CM | POA: Insufficient documentation

## 2013-11-24 DIAGNOSIS — K219 Gastro-esophageal reflux disease without esophagitis: Secondary | ICD-10-CM | POA: Insufficient documentation

## 2013-11-24 DIAGNOSIS — Z043 Encounter for examination and observation following other accident: Secondary | ICD-10-CM | POA: Insufficient documentation

## 2013-11-24 DIAGNOSIS — E785 Hyperlipidemia, unspecified: Secondary | ICD-10-CM | POA: Diagnosis not present

## 2013-11-24 DIAGNOSIS — R296 Repeated falls: Secondary | ICD-10-CM | POA: Diagnosis not present

## 2013-11-24 DIAGNOSIS — F411 Generalized anxiety disorder: Secondary | ICD-10-CM | POA: Insufficient documentation

## 2013-11-24 DIAGNOSIS — Z792 Long term (current) use of antibiotics: Secondary | ICD-10-CM | POA: Diagnosis not present

## 2013-11-24 DIAGNOSIS — Y939 Activity, unspecified: Secondary | ICD-10-CM | POA: Diagnosis not present

## 2013-11-24 DIAGNOSIS — Y929 Unspecified place or not applicable: Secondary | ICD-10-CM | POA: Diagnosis not present

## 2013-11-24 DIAGNOSIS — F0390 Unspecified dementia without behavioral disturbance: Secondary | ICD-10-CM | POA: Diagnosis not present

## 2013-11-24 DIAGNOSIS — I10 Essential (primary) hypertension: Secondary | ICD-10-CM | POA: Insufficient documentation

## 2013-11-24 DIAGNOSIS — N390 Urinary tract infection, site not specified: Secondary | ICD-10-CM | POA: Insufficient documentation

## 2013-11-24 DIAGNOSIS — Z86711 Personal history of pulmonary embolism: Secondary | ICD-10-CM | POA: Diagnosis not present

## 2013-11-24 HISTORY — DX: Hyperlipidemia, unspecified: E78.5

## 2013-11-24 HISTORY — DX: Essential (primary) hypertension: I10

## 2013-11-24 HISTORY — DX: Unspecified atrial fibrillation: I48.91

## 2013-11-24 LAB — URINALYSIS, ROUTINE W REFLEX MICROSCOPIC
Bilirubin Urine: NEGATIVE
Glucose, UA: NEGATIVE mg/dL
KETONES UR: NEGATIVE mg/dL
NITRITE: NEGATIVE
PH: 6 (ref 5.0–8.0)
Protein, ur: NEGATIVE mg/dL
Urobilinogen, UA: 0.2 mg/dL (ref 0.0–1.0)

## 2013-11-24 LAB — CBC WITH DIFFERENTIAL/PLATELET
BASOS ABS: 0 10*3/uL (ref 0.0–0.1)
Basophils Relative: 0 % (ref 0–1)
Eosinophils Absolute: 0 10*3/uL (ref 0.0–0.7)
Eosinophils Relative: 1 % (ref 0–5)
HCT: 47 % — ABNORMAL HIGH (ref 36.0–46.0)
Hemoglobin: 15.2 g/dL — ABNORMAL HIGH (ref 12.0–15.0)
LYMPHS PCT: 22 % (ref 12–46)
Lymphs Abs: 1.7 10*3/uL (ref 0.7–4.0)
MCH: 28.3 pg (ref 26.0–34.0)
MCHC: 32.3 g/dL (ref 30.0–36.0)
MCV: 87.5 fL (ref 78.0–100.0)
Monocytes Absolute: 0.5 10*3/uL (ref 0.1–1.0)
Monocytes Relative: 7 % (ref 3–12)
NEUTROS PCT: 70 % (ref 43–77)
Neutro Abs: 5.2 10*3/uL (ref 1.7–7.7)
PLATELETS: 179 10*3/uL (ref 150–400)
RBC: 5.37 MIL/uL — ABNORMAL HIGH (ref 3.87–5.11)
RDW: 14.8 % (ref 11.5–15.5)
WBC: 7.5 10*3/uL (ref 4.0–10.5)

## 2013-11-24 LAB — BASIC METABOLIC PANEL
Anion gap: 11 (ref 5–15)
BUN: 20 mg/dL (ref 6–23)
CO2: 26 meq/L (ref 19–32)
Calcium: 8.9 mg/dL (ref 8.4–10.5)
Chloride: 102 mEq/L (ref 96–112)
Creatinine, Ser: 1.15 mg/dL — ABNORMAL HIGH (ref 0.50–1.10)
GFR calc non Af Amer: 41 mL/min — ABNORMAL LOW (ref >90)
GFR, EST AFRICAN AMERICAN: 47 mL/min — AB (ref >90)
Glucose, Bld: 90 mg/dL (ref 70–99)
Potassium: 4.6 mEq/L (ref 3.7–5.3)
SODIUM: 139 meq/L (ref 137–147)

## 2013-11-24 LAB — URINE MICROSCOPIC-ADD ON

## 2013-11-24 MED ORDER — CEPHALEXIN 500 MG PO CAPS: 500.0000 mg | ORAL_CAPSULE | Freq: Four times a day (QID) | ORAL | Status: DC

## 2013-11-24 NOTE — ED Notes (Signed)
Pt is responding to speech at this time. Pt is speaking to staff, unsure of how she fell.

## 2013-11-24 NOTE — ED Provider Notes (Signed)
CSN: [id number]     Arrival date & time 11/24/13  1130 History  This chart was scribed for Shon Baton, MD by Milly Jakob, ED Scribe. The patient was seen in room Room/bed info not found. Patient's care was started at 11:27 AM.   Chief Complaint  Patient presents with  . Fall   The history is provided by the EMS personnel. The history is limited by the condition of the patient. No language interpreter was used.   HPI Comments: Level 5 Caveat: Dementia  [name] is a 80 y.o. female who was brought into the Emergency Department by the EMS from the Alzheimer's unit at [facility] unresponsive after a possible fall. Her baseline is very hard of hearing.   Patient does not have her hearing aids in. She will not open her eyes to voice. She initially would not open her eyes to voice.  No obvious injury noted.   Spoke with facility. Patient is full code. Last seen normal walking around the unit at 10:45 AM. Was found in another resident's room on the floor. She does take Xarelto.  Past Medical History  Diagnosis Date  . Hiatal hernia   . Dementia   . Thyroid disease   . GERD (gastroesophageal reflux disease)   . Diverticulosis of colon (without mention of hemorrhage)     Diverticulosis  . Pulmonary emboli 04/2012  . Anxiety   . Atrial fibrillation   . Hyperlipidemia   . Hypertension    Past Surgical History  Procedure Laterality Date  . Cholecystectomy     History reviewed. No pertinent family history. History  Substance Use Topics  . Smoking status: Never Smoker   . Smokeless tobacco: Not on file  . Alcohol Use: No   OB History   Grav Para Term Preterm Abortions TAB SAB Ect Mult Living                 Review of Systems  Unable to perform ROS: Dementia      Allergies  Review of patient's allergies indicates no known allergies.  Home Medications   Prior to Admission medications   Medication Sig Start Date End Date Taking? Authorizing Provider  acetaminophen  (TYLENOL) 500 MG tablet Take 500 mg by mouth every 6 (six) hours as needed for mild pain.   Yes Historical Provider, MD  donepezil (ARICEPT) 10 MG tablet Take 10 mg by mouth at bedtime.    Yes Historical Provider, MD  furosemide (LASIX) 40 MG tablet Take 40 mg by mouth daily as needed for fluid.   Yes Historical Provider, MD  levothyroxine (SYNTHROID, LEVOTHROID) 50 MCG tablet Take 50 mcg by mouth daily.    Yes Historical Provider, MD  loperamide (IMODIUM A-D) 2 MG tablet Take 2 mg by mouth at bedtime. Do not give if abdominal pain or fever.   Yes Historical Provider, MD  memantine (NAMENDA) 10 MG tablet Take 10 mg by mouth 2 (two) times daily.   Yes Historical Provider, MD  pantoprazole (PROTONIX) 20 MG tablet Take 20 mg by mouth daily.   Yes Historical Provider, MD  rivaroxaban (XARELTO) 20 MG TABS tablet Take 20 mg by mouth daily.    Yes Historical Provider, MD  traZODone (DESYREL) 50 MG tablet Take 25 mg by mouth at bedtime as needed for sleep.   Yes Historical Provider, MD  cephALEXin (KEFLEX) 500 MG capsule Take 1 capsule (500 mg total) by mouth 4 (four) times daily. 11/24/13   Mayer Masker  Jaqua Ching, MD   BP 128/81  Pulse 88  Temp(Src) 98.1 F (36.7 C) (Oral)  Resp 15  Ht  (1.575 m)  Wt 120 lb (54.432 kg)  BMI 21.94 kg/m2  SpO2 99% Physical Exam  Nursing note and vitals reviewed. Constitutional: No distress.  Elderly  HENT:  Head: Normocephalic and atraumatic.  Mouth/Throat: Oropharynx is clear and moist.  Eyes: Pupils are equal, round, and reactive to light.  Pupils 4 mm reactive bilaterally  Neck: Neck supple.  C. collar in place  Cardiovascular: Normal rate, regular rhythm and normal heart sounds.   No murmur heard. Pulmonary/Chest: Effort normal and breath sounds normal. No respiratory distress. She has no wheezes.  Abdominal: Soft. Bowel sounds are normal. There is no tenderness. There is no rebound.  Musculoskeletal:  Normal range of motion of bilateral hips and knees   Neurological:  Somnolent, not arousable to voice  Skin: Skin is warm and dry.    ED Course  Procedures (including critical care time) Labs Review Labs Reviewed  CBC WITH DIFFERENTIAL - Abnormal; Notable for the following:    RBC 5.37 (*)    Hemoglobin 15.2 (*)    HCT 47.0 (*)    All other components within normal limits  BASIC METABOLIC PANEL - Abnormal; Notable for the following:    Creatinine, Ser 1.15 (*)    GFR calc non Af Amer 41 (*)    GFR calc Af Amer 47 (*)    All other components within normal limits  URINALYSIS, ROUTINE W REFLEX MICROSCOPIC - Abnormal; Notable for the following:    Color, Urine STRAW (*)    APPearance HAZY (*)    Specific Gravity, Urine <1.005 (*)    Hgb urine dipstick SMALL (*)    Leukocytes, UA LARGE (*)    All other components within normal limits  URINE MICROSCOPIC-ADD ON - Abnormal; Notable for the following:    Squamous Epithelial / LPF FEW (*)    Bacteria, UA MANY (*)    All other components within normal limits  URINE CULTURE    Imaging Review Ct Head Wo Contrast  11/24/2013   CLINICAL DATA:  Status post fall; history of dementia  EXAM: CT HEAD WITHOUT CONTRAST  CT CERVICAL SPINE WITHOUT CONTRAST  TECHNIQUE: Multidetector CT imaging of the head and cervical spine was performed following the standard protocol without intravenous contrast. Multiplanar CT image reconstructions of the cervical spine were also generated.  COMPARISON:  None.  FINDINGS: CT HEAD FINDINGS  There is mild diffuse cerebral and cerebellar atrophy with compensatory ventriculomegaly. There is decreased density in the deep white matter of both cerebral hemispheres consistent with chronic small vessel ischemic change. There is punctate basal ganglia calcification on the right. There is no acute intracranial hemorrhage nor evidence of an acute ischemic event.  The observed paranasal sinuses and mastoid air cells are clear. There is no acute skull fracture. There is no definite  cephalohematoma.  CT CERVICAL SPINE FINDINGS  There is chronic fusion across the C5-6 disc level. There is partial fusion across the C4-5 and C6-7 disc levels. The prevertebral soft tissue spaces are unremarkable. There is moderate degenerative facet joint change at multiple levels with fusion at C4-5. And C5-6 on the right and at C6- C7 on the left. The spinous processes are intact. There is severe degenerative change of the atlanto dens articulation. The ring of C1 is intact. The observed first and second ribs are also are intact.  There are calcified nodules  in both thyroid lobes.  IMPRESSION: 1. There is no acute intracranial hemorrhage nor evidence of other acute intracranial injury. 2. There is mild diffuse atrophy with compensatory ventriculomegaly. 3. There is no acute skull fracture. 4. There is no acute cervical spine fracture nor dislocation. There are extensive chronic changes of the mid cervical spine involving the discs and facet joints. 5. There are calcified nodules in both thyroid lobes. Elective thyroid ultrasound is recommended when the patient can tolerate the procedure.   Electronically Signed   By: David  [place]   On: 11/24/2013 12:57   Ct Cervical Spine Wo Contrast  11/24/2013   CLINICAL DATA:  Status post fall; history of dementia  EXAM: CT HEAD WITHOUT CONTRAST  CT CERVICAL SPINE WITHOUT CONTRAST  TECHNIQUE: Multidetector CT imaging of the head and cervical spine was performed following the standard protocol without intravenous contrast. Multiplanar CT image reconstructions of the cervical spine were also generated.  COMPARISON:  None.  FINDINGS: CT HEAD FINDINGS  There is mild diffuse cerebral and cerebellar atrophy with compensatory ventriculomegaly. There is decreased density in the deep white matter of both cerebral hemispheres consistent with chronic small vessel ischemic change. There is punctate basal ganglia calcification on the right. There is no acute intracranial hemorrhage nor  evidence of an acute ischemic event.  The observed paranasal sinuses and mastoid air cells are clear. There is no acute skull fracture. There is no definite cephalohematoma.  CT CERVICAL SPINE FINDINGS  There is chronic fusion across the C5-6 disc level. There is partial fusion across the C4-5 and C6-7 disc levels. The prevertebral soft tissue spaces are unremarkable. There is moderate degenerative facet joint change at multiple levels with fusion at C4-5. And C5-6 on the right and at C6- C7 on the left. The spinous processes are intact. There is severe degenerative change of the atlanto dens articulation. The ring of C1 is intact. The observed first and second ribs are also are intact.  There are calcified nodules in both thyroid lobes.  IMPRESSION: 1. There is no acute intracranial hemorrhage nor evidence of other acute intracranial injury. 2. There is mild diffuse atrophy with compensatory ventriculomegaly. 3. There is no acute skull fracture. 4. There is no acute cervical spine fracture nor dislocation. There are extensive chronic changes of the mid cervical spine involving the discs and facet joints. 5. There are calcified nodules in both thyroid lobes. Elective thyroid ultrasound is recommended when the patient can tolerate the procedure.   Electronically Signed   By: David  [place]   On: 11/24/2013 12:57     EKG Interpretation   Date/Time:  Monday November 24 2013 11:35:14 EDT Ventricular Rate:  68 PR Interval:  136 QRS Duration: 99 QT Interval:  426 QTC Calculation: 453 R Axis:   23 Text Interpretation:  Sinus rhythm Low voltage, extremity leads  Nonspecific T abnrm, anterolateral leads Artifact Confirmed by Wilkie Aye  MD,  Toni Amend ([id number]) on 11/24/2013 12:13:16 PM      MDM   Final diagnoses:  Altered mental status, unspecified altered mental status type  Urinary tract infection without hematuria, site unspecified    Patient presents after being found down. ABCs are intact. She did not  respond to voice on initial evaluation; however, after approximately 10 minutes, patient woke up, took her c-collar off, and began to yell loudly. She continues to be disoriented and agitated. No obvious signs of trauma. EKG is reassuring.  CT head and neck obtained given his reports of possible fall  and patient being on Xarelto. CT scan is negative. Urinalysis with evidence of urinary tract infection. This may be the patient's source of altered mental status. On repeat exam, she does not appear to be focal. She is moving all 4 extremities. Will treat for urinary tract infection.  After history, exam, and medical workup I feel the patient has been appropriately medically screened and is safe for discharge home. Pertinent diagnoses were discussed with the patient. Patient was given return precautions.   I personally performed the services described in this documentation, which was scribed in my presence. The recorded information has been reviewed and is accurate.    Shon Baton, MD 11/24/13 [id number]

## 2013-11-24 NOTE — ED Notes (Signed)
Pt talking to self and screaming out, per report this is pt's baseline.

## 2013-11-24 NOTE — ED Notes (Signed)
Pt pulled out her IV, catheter intact, bleeding controlled, also pt pulled C-Collar off.

## 2013-11-24 NOTE — ED Notes (Signed)
Pt sent to ER via EMS, pt is a resident of [facility] [place] [address], pt experienced fall approx 30 minutes ago, pt is hard of hear of hearing. Pt found on ground, pt has injury to upper lip, no other injuries noted. Pt typically speaks and ambulates. Pt does not localize to pain, will not open eyes, no voluntary movement of extremities, muscle tone noted. Pt does have HX of alzheimers, takes xarelto for prior PE.

## 2013-11-24 NOTE — Discharge Instructions (Signed)

## 2013-11-24 NOTE — ED Notes (Signed)
Pt alert and answering questions, pleasantly confused which is her cognitive baseline per facility nurses.

## 2013-11-24 NOTE — ED Notes (Signed)
Pt is a resident of [facility], nurses at facility state the pt was alert and walking prior to fall.

## 2013-11-25 LAB — URINE CULTURE
CULTURE: NO GROWTH
Colony Count: NO GROWTH

## 2014-01-02 ENCOUNTER — Encounter (HOSPITAL_COMMUNITY): Payer: Self-pay | Admitting: Emergency Medicine

## 2014-01-02 ENCOUNTER — Emergency Department (HOSPITAL_COMMUNITY): Payer: Medicare Other

## 2014-01-02 ENCOUNTER — Emergency Department (HOSPITAL_COMMUNITY)
Admission: EM | Admit: 2014-01-02 | Discharge: 2014-01-02 | Disposition: A | Discharge status: Home / Self Care | Payer: Medicare Other | Attending: Emergency Medicine | Admitting: Emergency Medicine

## 2014-01-02 DIAGNOSIS — I10 Essential (primary) hypertension: Secondary | ICD-10-CM | POA: Insufficient documentation

## 2014-01-02 DIAGNOSIS — R55 Syncope and collapse: Secondary | ICD-10-CM | POA: Diagnosis not present

## 2014-01-02 DIAGNOSIS — F0390 Unspecified dementia without behavioral disturbance: Secondary | ICD-10-CM | POA: Insufficient documentation

## 2014-01-02 DIAGNOSIS — R4020 Unspecified coma: Secondary | ICD-10-CM | POA: Diagnosis present

## 2014-01-02 DIAGNOSIS — Z79899 Other long term (current) drug therapy: Secondary | ICD-10-CM | POA: Insufficient documentation

## 2014-01-02 DIAGNOSIS — F419 Anxiety disorder, unspecified: Secondary | ICD-10-CM | POA: Diagnosis not present

## 2014-01-02 DIAGNOSIS — Z86711 Personal history of pulmonary embolism: Secondary | ICD-10-CM | POA: Insufficient documentation

## 2014-01-02 DIAGNOSIS — E079 Disorder of thyroid, unspecified: Secondary | ICD-10-CM | POA: Insufficient documentation

## 2014-01-02 DIAGNOSIS — Z8669 Personal history of other diseases of the nervous system and sense organs: Secondary | ICD-10-CM | POA: Diagnosis not present

## 2014-01-02 DIAGNOSIS — R569 Unspecified convulsions: Secondary | ICD-10-CM | POA: Insufficient documentation

## 2014-01-02 DIAGNOSIS — Z7901 Long term (current) use of anticoagulants: Secondary | ICD-10-CM | POA: Diagnosis not present

## 2014-01-02 LAB — CBC WITH DIFFERENTIAL/PLATELET
BASOS PCT: 1 % (ref 0–1)
Basophils Absolute: 0 10*3/uL (ref 0.0–0.1)
EOS ABS: 0.1 10*3/uL (ref 0.0–0.7)
Eosinophils Relative: 1 % (ref 0–5)
HEMATOCRIT: 48 % — AB (ref 36.0–46.0)
HEMOGLOBIN: 15.6 g/dL — AB (ref 12.0–15.0)
Lymphocytes Relative: 24 % (ref 12–46)
Lymphs Abs: 1.5 10*3/uL (ref 0.7–4.0)
MCH: 28.2 pg (ref 26.0–34.0)
MCHC: 32.5 g/dL (ref 30.0–36.0)
MCV: 86.6 fL (ref 78.0–100.0)
MONO ABS: 0.4 10*3/uL (ref 0.1–1.0)
Monocytes Relative: 7 % (ref 3–12)
Neutro Abs: 4 10*3/uL (ref 1.7–7.7)
Neutrophils Relative %: 67 % (ref 43–77)
Platelets: 186 10*3/uL (ref 150–400)
RBC: 5.54 MIL/uL — AB (ref 3.87–5.11)
RDW: 15 % (ref 11.5–15.5)
WBC: 6 10*3/uL (ref 4.0–10.5)

## 2014-01-02 LAB — COMPREHENSIVE METABOLIC PANEL
ALBUMIN: 3.1 g/dL — AB (ref 3.5–5.2)
ALK PHOS: 114 U/L (ref 39–117)
ALT: 8 U/L (ref 0–35)
ANION GAP: 13 (ref 5–15)
AST: 15 U/L (ref 0–37)
BILIRUBIN TOTAL: 0.6 mg/dL (ref 0.3–1.2)
BUN: 14 mg/dL (ref 6–23)
CO2: 25 mEq/L (ref 19–32)
Calcium: 8.7 mg/dL (ref 8.4–10.5)
Chloride: 106 mEq/L (ref 96–112)
Creatinine, Ser: 1.03 mg/dL (ref 0.50–1.10)
GFR calc non Af Amer: 47 mL/min — ABNORMAL LOW (ref >90)
GFR, EST AFRICAN AMERICAN: 54 mL/min — AB (ref >90)
GLUCOSE: 95 mg/dL (ref 70–99)
POTASSIUM: 3.8 meq/L (ref 3.7–5.3)
Sodium: 144 mEq/L (ref 137–147)
TOTAL PROTEIN: 6.5 g/dL (ref 6.0–8.3)

## 2014-01-02 LAB — URINE MICROSCOPIC-ADD ON

## 2014-01-02 LAB — URINALYSIS, ROUTINE W REFLEX MICROSCOPIC
Glucose, UA: NEGATIVE mg/dL
Hgb urine dipstick: NEGATIVE
Ketones, ur: NEGATIVE mg/dL
Leukocytes, UA: NEGATIVE
NITRITE: NEGATIVE
Specific Gravity, Urine: 1.015 (ref 1.005–1.030)
UROBILINOGEN UA: 2 mg/dL — AB (ref 0.0–1.0)
pH: 6.5 (ref 5.0–8.0)

## 2014-01-02 LAB — POC OCCULT BLOOD, ED: Fecal Occult Bld: NEGATIVE

## 2014-01-02 LAB — TROPONIN I

## 2014-01-02 MED ORDER — SODIUM CHLORIDE 0.9 % IV BOLUS (SEPSIS)
1000.0000 mL | Freq: Once | INTRAVENOUS | Status: AC
  Administered 2014-01-02: 1000 mL via INTRAVENOUS

## 2014-01-02 NOTE — ED Notes (Signed)
Called [facility] for transport

## 2014-01-02 NOTE — ED Notes (Signed)
Pt attempted to pull out IV x2.. Tegaderm replaced and IV rewrapped.

## 2014-01-02 NOTE — ED Notes (Signed)
Resident of [facility]. Pt became unresponsive while sitting in a wheelchair for ~3 min, then pt began shaking all over. Nurse stated pt initially had facial drooping per EMS. Pt is alert on arrival. O2 sats were in the 80s with BP of 90/67 initially ion scene

## 2014-01-02 NOTE — Discharge Instructions (Signed)
Seizure, Adult We suspect that [name] suffer from a seizure today. Have her return if concerned for any reason or contact her physician A seizure is abnormal electrical activity in the brain. Seizures usually last from 30 seconds to 2 minutes. There are various types of seizures. Before a seizure, you may have a warning sensation (aura) that a seizure is about to occur. An aura may include the following symptoms:   Fear or anxiety.  Nausea.  Feeling like the room is spinning (vertigo).  Vision changes, such as seeing flashing lights or spots. Common symptoms during a seizure include:  A change in attention or behavior (altered mental status).  Convulsions with rhythmic jerking movements.  Drooling.  Rapid eye movements.  Grunting.  Loss of bladder and bowel control.  Bitter taste in the mouth.  Tongue biting. After a seizure, you may feel confused and sleepy. You may also have an injury resulting from convulsions during the seizure. HOME CARE INSTRUCTIONS   If you are given medicines, take them exactly as prescribed by your health care provider.  Keep all follow-up appointments as directed by your health care provider.  Do not swim or drive or engage in risky activity during which a seizure could cause further injury to you or others until your health care provider says it is OK.  Get adequate rest.  Teach friends and family what to do if you have a seizure. They should:  Lay you on the ground to prevent a fall.  Put a cushion under your head.  Loosen any tight clothing around your neck.  Turn you on your side. If vomiting occurs, this helps keep your airway clear.  Stay with you until you recover.  Know whether or not you need emergency care. SEEK IMMEDIATE MEDICAL CARE IF:  The seizure lasts longer than 5 minutes.  The seizure is severe or you do not wake up immediately after the seizure.  You have an altered mental status after the seizure.  You are  having more frequent or worsening seizures. Someone should drive you to the emergency department or call local emergency services (911 in [place] MAKE SURE YOU:  Understand these instructions.  Will watch your condition.  Will get help right away if you are not doing well or get worse. Document Released: 03/17/2000 Document Revised: 01/08/2013 Document Reviewed: 10/30/2012 [facility] Patient Information 2015 [place] [place]. This information is not intended to replace advice given to you by your health care provider. Make sure you discuss any questions you have with your health care provider.

## 2014-01-02 NOTE — ED Provider Notes (Addendum)
CSN: [id number]     Arrival date & time 01/02/14  4098 History       Chief Complaint  Patient presents with  . Loss of Consciousness    Level 5 Caveat - dementia  Patient is a 80 y.o. female presenting with syncope. No language interpreter was used.  Loss of Consciousness  HPI Comments: [name] is a 80 y.o. female who presents to the Emergency Department complaining of  History is obtained from [facility],, med tech at assisted livin"g facility via telephone. This morning while at therapy at approximately 9:30 AM patient became "shaky with shakes of her entire body, her eyes then rolled back into her head, and she slid to the ground. She became more conscious gradually over several minutes Past Medical History  Diagnosis Date  . Hiatal hernia   . Dementia   . Thyroid disease   . GERD (gastroesophageal reflux disease)   . Diverticulosis of colon (without mention of hemorrhage)     Diverticulosis  . Pulmonary emboli 04/2012  . Anxiety   . Atrial fibrillation   . Hyperlipidemia   . Hypertension    Past Surgical History  Procedure Laterality Date  . Cholecystectomy     No family history on file. History  Substance Use Topics  . Smoking status: Never Smoker   . Smokeless tobacco: Not on file  . Alcohol Use: No   OB History   Grav Para Term Preterm Abortions TAB SAB Ect Mult Living                 Review of Systems  Unable to perform ROS: Dementia  Cardiovascular: Positive for syncope.      Allergies  Review of patient's allergies indicates no known allergies.  Home Medications   Prior to Admission medications   Medication Sig Start Date End Date Taking? Authorizing Provider  acetaminophen (TYLENOL) 500 MG tablet Take 500 mg by mouth every 6 (six) hours as needed for mild pain.   Yes Historical Provider, MD  donepezil (ARICEPT) 10 MG tablet Take 10 mg by mouth at bedtime.    Yes Historical Provider, MD  furosemide (LASIX) 40 MG tablet Take 40 mg  by mouth daily as needed for fluid.   Yes Historical Provider, MD  levothyroxine (SYNTHROID, LEVOTHROID) 50 MCG tablet Take 50 mcg by mouth daily.    Yes Historical Provider, MD  loperamide (IMODIUM A-D) 2 MG tablet Take 2 mg by mouth at bedtime. Do not give if abdominal pain or fever.   Yes Historical Provider, MD  memantine (NAMENDA) 10 MG tablet Take 10 mg by mouth 2 (two) times daily.   Yes Historical Provider, MD  rivaroxaban (XARELTO) 20 MG TABS tablet Take 20 mg by mouth daily.    Yes Historical Provider, MD  traZODone (DESYREL) 50 MG tablet Take 25 mg by mouth at bedtime as needed for sleep.   Yes Historical Provider, MD   BP 104/60  Pulse 114  Temp(Src) 97.2 F (36.2 C) (Oral)  Resp 22  Wt 120 lb (54.432 kg)  SpO2 94% Physical Exam  Nursing note and vitals reviewed. Constitutional: She appears well-developed and well-nourished.  HENT:  Head: Normocephalic and atraumatic.  Eyes: Conjunctivae are normal. Pupils are equal, round, and reactive to light.  Neck: Neck supple. No tracheal deviation present. No thyromegaly present.  Cardiovascular: Normal rate and regular rhythm.   No murmur heard. Pulmonary/Chest: Effort normal and breath sounds normal.  Abdominal: Soft. Bowel sounds are normal. She  exhibits no distension. There is no tenderness.  Genitourinary:  Rectum normal tone brown stool Hemoccult negative  Musculoskeletal: Normal range of motion. She exhibits no edema and no tenderness.  Neurological: She is alert.  Moves all extremities motor strength 5 over 5 overall does not follow simple commands  Skin: Skin is warm and dry. No rash noted.  Psychiatric:  Unobtainable. Dementia    ED Course  Procedures  DIAGNOSTIC STUDIES: Oxygen Saturation is 94% on RA, normal by my interpretation.    COORDINATION OF CARE: 10:40 AM Discussed treatment plan with pt at bedside and pt agreed to plan.   Labs Review Labs Reviewed - No data to display  Imaging Review No results  found.   EKG Interpretation   Date/Time:  Friday January 02 2014 10:05:42 EDT Ventricular Rate:  80 PR Interval:  140 QRS Duration: 100 QT Interval:  436 QTC Calculation: 503 R Axis:   -40 Text Interpretation:  Sinus rhythm Multiple premature complexes, vent   Left axis deviation Low voltage, extremity leads Consider anterior infarct  No significant change since last tracing Confirmed by Ethelda Chick  MD, Annalyssa Thune  [phone]) on 01/02/2014 11:00:59 AM      Results for orders placed during the hospital encounter of 01/02/14  CBC WITH DIFFERENTIAL      Result Value Ref Range   WBC 6.0  4.0 - 10.5 K/uL   RBC 5.54 (*) 3.87 - 5.11 MIL/uL   Hemoglobin 15.6 (*) 12.0 - 15.0 g/dL   HCT 60.4 (*) 54.0 - 98.1 %   MCV 86.6  78.0 - 100.0 fL   MCH 28.2  26.0 - 34.0 pg   MCHC 32.5  30.0 - 36.0 g/dL   RDW 19.1  47.8 - 29.5 %   Platelets 186  150 - 400 K/uL   Neutrophils Relative % 67  43 - 77 %   Neutro Abs 4.0  1.7 - 7.7 K/uL   Lymphocytes Relative 24  12 - 46 %   Lymphs Abs 1.5  0.7 - 4.0 K/uL   Monocytes Relative 7  3 - 12 %   Monocytes Absolute 0.4  0.1 - 1.0 K/uL   Eosinophils Relative 1  0 - 5 %   Eosinophils Absolute 0.1  0.0 - 0.7 K/uL   Basophils Relative 1  0 - 1 %   Basophils Absolute 0.0  0.0 - 0.1 K/uL  COMPREHENSIVE METABOLIC PANEL      Result Value Ref Range   Sodium 144  137 - 147 mEq/L   Potassium 3.8  3.7 - 5.3 mEq/L   Chloride 106  96 - 112 mEq/L   CO2 25  19 - 32 mEq/L   Glucose, Bld 95  70 - 99 mg/dL   BUN 14  6 - 23 mg/dL   Creatinine, Ser 6.21  0.50 - 1.10 mg/dL   Calcium 8.7  8.4 - 30.8 mg/dL   Total Protein 6.5  6.0 - 8.3 g/dL   Albumin 3.1 (*) 3.5 - 5.2 g/dL   AST 15  0 - 37 U/L   ALT 8  0 - 35 U/L   Alkaline Phosphatase 114  39 - 117 U/L   Total Bilirubin 0.6  0.3 - 1.2 mg/dL   GFR calc non Af Amer 47 (*) >90 mL/min   GFR calc Af Amer 54 (*) >90 mL/min   Anion gap 13  5 - 15  TROPONIN I      Result Value Ref Range   Troponin I <  0.30  <0.30 ng/mL   URINALYSIS, ROUTINE W REFLEX MICROSCOPIC      Result Value Ref Range   Color, Urine YELLOW  YELLOW   APPearance CLEAR  CLEAR   Specific Gravity, Urine 1.015  1.005 - 1.030   pH 6.5  5.0 - 8.0   Glucose, UA NEGATIVE  NEGATIVE mg/dL   Hgb urine dipstick NEGATIVE  NEGATIVE   Bilirubin Urine SMALL (*) NEGATIVE   Ketones, ur NEGATIVE  NEGATIVE mg/dL   Protein, ur TRACE (*) NEGATIVE mg/dL   Urobilinogen, UA 2.0 (*) 0.0 - 1.0 mg/dL   Nitrite NEGATIVE  NEGATIVE   Leukocytes, UA NEGATIVE  NEGATIVE  URINE MICROSCOPIC-ADD ON      Result Value Ref Range   Squamous Epithelial / LPF FEW (*) RARE   WBC, UA 0-2  <3 WBC/hpf   Bacteria, UA FEW (*) RARE  POC OCCULT BLOOD, ED      Result Value Ref Range   Fecal Occult Bld NEGATIVE  NEGATIVE   Ct Head Wo Contrast  01/02/2014   CLINICAL DATA:  80 year old female with syncope  EXAM: CT HEAD WITHOUT CONTRAST  TECHNIQUE: Contiguous axial images were obtained from the base of the skull through the vertex without intravenous contrast.  COMPARISON:  11/24/2013  FINDINGS: Unremarkable appearance of the calvarium without fracture or aggressive lesion.  No paranasal sinus disease.  Mastoid air cells are clear.  Unremarkable appearance of the orbits.  No acute intracranial hemorrhage, midline shift, or mass effect.  Enlarged ventricles system is unchanged from the comparison. Periventricular white matter hypodensity. Brain volume loss again demonstrated.  Gray-white differentiation maintained.  IMPRESSION: No CT evidence of acute intracranial abnormality.  Senescent brain volume loss with chronic small vessel disease and expansion of the ventricles. Appearance is similar to comparison.  Signed,  Yvone NeuJaime S. Loreta AveWagner, DO  [facility]  [facility]   Electronically Signed   By: Gilmer MorJaime  Wagner D.O.   On: 01/02/2014 12:50    1:15 PM patient's son, [name] is here and states that patient is acting at approximately her  baseline. MDM  History is compatible with a seizureFinal diagnoses:  None   I spoke with patient's son and power of attorney [name] who is in agreement that patient can go back to assisted living facility based upon my clinical judgment, and given that he wishes for his mother to be comfortable. He feels no further workup indicated  Diagnosis seizure I personally performed the services described in this documentation, which was scribed in my presence. The recorded information has been reviewed and considered.  Doug SouSam Noelie Renfrow, MD 01/02/14 1323  Doug SouSam Jalal Rauch, MD 01/02/14 1330

## 2014-02-11 ENCOUNTER — Encounter (HOSPITAL_COMMUNITY): Payer: Self-pay

## 2014-02-11 ENCOUNTER — Emergency Department (HOSPITAL_COMMUNITY)
Admission: EM | Admit: 2014-02-11 | Discharge: 2014-02-12 | Disposition: A | Discharge status: Home / Self Care | Payer: Medicare Other | Attending: Emergency Medicine | Admitting: Emergency Medicine

## 2014-02-11 DIAGNOSIS — F0390 Unspecified dementia without behavioral disturbance: Secondary | ICD-10-CM | POA: Diagnosis not present

## 2014-02-11 DIAGNOSIS — Y9389 Activity, other specified: Secondary | ICD-10-CM | POA: Insufficient documentation

## 2014-02-11 DIAGNOSIS — Z7902 Long term (current) use of antithrombotics/antiplatelets: Secondary | ICD-10-CM | POA: Diagnosis not present

## 2014-02-11 DIAGNOSIS — Z79899 Other long term (current) drug therapy: Secondary | ICD-10-CM | POA: Diagnosis not present

## 2014-02-11 DIAGNOSIS — Y9289 Other specified places as the place of occurrence of the external cause: Secondary | ICD-10-CM | POA: Insufficient documentation

## 2014-02-11 DIAGNOSIS — E079 Disorder of thyroid, unspecified: Secondary | ICD-10-CM | POA: Insufficient documentation

## 2014-02-11 DIAGNOSIS — W06XXXA Fall from bed, initial encounter: Secondary | ICD-10-CM | POA: Diagnosis not present

## 2014-02-11 DIAGNOSIS — F419 Anxiety disorder, unspecified: Secondary | ICD-10-CM | POA: Insufficient documentation

## 2014-02-11 DIAGNOSIS — I10 Essential (primary) hypertension: Secondary | ICD-10-CM | POA: Diagnosis not present

## 2014-02-11 DIAGNOSIS — W19XXXA Unspecified fall, initial encounter: Secondary | ICD-10-CM

## 2014-02-11 DIAGNOSIS — Z86711 Personal history of pulmonary embolism: Secondary | ICD-10-CM | POA: Diagnosis not present

## 2014-02-11 DIAGNOSIS — Z043 Encounter for examination and observation following other accident: Secondary | ICD-10-CM | POA: Insufficient documentation

## 2014-02-11 DIAGNOSIS — Z8719 Personal history of other diseases of the digestive system: Secondary | ICD-10-CM | POA: Insufficient documentation

## 2014-02-11 NOTE — ED Notes (Signed)
[facility] via [facility], [facility]. Patient "rolled" out of bed per [facility]. Upon arrival patient is on LSB and C-collar. Patient is resistant to treatment, vocal, and able to move all extremities well. Patient has a history of dementia, and is not a good historian of events.

## 2014-02-12 ENCOUNTER — Emergency Department (HOSPITAL_COMMUNITY): Payer: Medicare Other

## 2014-02-12 DIAGNOSIS — Z043 Encounter for examination and observation following other accident: Secondary | ICD-10-CM | POA: Diagnosis not present

## 2014-02-12 LAB — URINALYSIS, ROUTINE W REFLEX MICROSCOPIC
Bilirubin Urine: NEGATIVE
GLUCOSE, UA: NEGATIVE mg/dL
Ketones, ur: NEGATIVE mg/dL
Nitrite: NEGATIVE
PH: 7 (ref 5.0–8.0)
Protein, ur: NEGATIVE mg/dL
Specific Gravity, Urine: 1.01 (ref 1.005–1.030)
Urobilinogen, UA: 0.2 mg/dL (ref 0.0–1.0)

## 2014-02-12 LAB — URINE MICROSCOPIC-ADD ON

## 2014-02-12 NOTE — ED Notes (Signed)
Patient out of department at this time by [facility] about [facility] return to facility.

## 2014-02-12 NOTE — ED Provider Notes (Signed)
CSN: [id number]     Arrival date & time 02/11/14  2339 History   First MD Initiated Contact with Patient 02/11/14 2353     Chief Complaint  Patient presents with  . Fall     (Consider location/radiation/quality/duration/timing/severity/associated sxs/prior Treatment) HPI  This is an 80 year old female who presents from [place] house after "rolling out of bed." Patient with a history of dementia and is noncontributory to history taking. Per report, patient rolled out of bed. Upon EMS arrival she was focal moved all 4 extremities but was hesitant to accept treatment. No obvious injury. Circumstances surrounding fall unknown at this time.  Level V caveat for dementia  Past Medical History  Diagnosis Date  . Hiatal hernia   . Dementia   . Thyroid disease   . GERD (gastroesophageal reflux disease)   . Diverticulosis of colon (without mention of hemorrhage)     Diverticulosis  . Pulmonary emboli 04/2012  . Anxiety   . Atrial fibrillation   . Hyperlipidemia   . Hypertension    Past Surgical History  Procedure Laterality Date  . Cholecystectomy     History reviewed. No pertinent family history. History  Substance Use Topics  . Smoking status: Never Smoker   . Smokeless tobacco: Not on file  . Alcohol Use: No   OB History    No data available     Review of Systems  Unable to perform ROS: Dementia      Allergies  Review of patient's allergies indicates no known allergies.  Home Medications   Prior to Admission medications   Medication Sig Start Date End Date Taking? Authorizing Provider  acetaminophen (TYLENOL) 500 MG tablet Take 500 mg by mouth every 6 (six) hours as needed for mild pain.   Yes Historical Provider, MD  donepezil (ARICEPT) 10 MG tablet Take 10 mg by mouth at bedtime.    Yes Historical Provider, MD  furosemide (LASIX) 40 MG tablet Take 40 mg by mouth daily as needed for fluid.   Yes Historical Provider, MD  levothyroxine (SYNTHROID, LEVOTHROID) 50  MCG tablet Take 50 mcg by mouth daily.    Yes Historical Provider, MD  loperamide (IMODIUM A-D) 2 MG tablet Take 2 mg by mouth at bedtime. Do not give if abdominal pain or fever.   Yes Historical Provider, MD  memantine (NAMENDA) 10 MG tablet Take 10 mg by mouth 2 (two) times daily.   Yes Historical Provider, MD  rivaroxaban (XARELTO) 20 MG TABS tablet Take 20 mg by mouth daily.    Yes Historical Provider, MD  traZODone (DESYREL) 50 MG tablet Take 25 mg by mouth at bedtime as needed for sleep.   Yes Historical Provider, MD   BP 131/68 mmHg  Pulse 85  Temp(Src) 98.3 F (36.8 C) (Oral)  Resp 22  Wt 120 lb (54.432 kg)  SpO2 94% Physical Exam  Constitutional:  Elderly, yelling  HENT:  Head: Normocephalic and atraumatic.  Mouth/Throat: Oropharynx is clear and moist.  Eyes: Pupils are equal, round, and reactive to light.  Neck:  c-collar in place by EMS  Cardiovascular: Normal rate, regular rhythm and normal heart sounds.   No murmur heard. Pulmonary/Chest: Effort normal. No respiratory distress. She exhibits no tenderness.  Abdominal: Soft. Bowel sounds are normal. There is no tenderness.  Musculoskeletal:  Tenderness with range of motion of the bilateral hips, no obvious deformity or foreshortening  Neurological: She is alert.  Moves all 4 extremities, not directable and does not follow commands readily, disoriented  Skin: Skin is warm and dry.  No obvious wounds or injury  Psychiatric:  Unable to assess  Nursing note and vitals reviewed.   ED Course  Procedures (including critical care time) Labs Review Labs Reviewed  URINALYSIS, ROUTINE W REFLEX MICROSCOPIC - Abnormal; Notable for the following:    Hgb urine dipstick LARGE (*)    Leukocytes, UA TRACE (*)    All other components within normal limits  URINE MICROSCOPIC-ADD ON - Abnormal; Notable for the following:    Bacteria, UA FEW (*)    All other components within normal limits    Imaging Review Dg Pelvis 1-2  Views  02/12/2014   CLINICAL DATA:  The patient fell from bed tonight. Pain. History of hip replacement. Initial encounter.  EXAM: PELVIS - 1-2 VIEW  COMPARISON:  None.  FINDINGS: Bipolar left hip hemiarthroplasty is in place. The hips are located. No fracture is identified.  IMPRESSION: No acute abnormality.   Electronically Signed   By: Drusilla Kanner M.D.   On: 02/12/2014 00:44   Ct Head Wo Contrast  02/12/2014   CLINICAL DATA:  Status post fall from bed tonight. Dementia. Initial encounter.  EXAM: CT HEAD WITHOUT CONTRAST  CT CERVICAL SPINE WITHOUT CONTRAST  TECHNIQUE: Multidetector CT imaging of the head and cervical spine was performed following the standard protocol without intravenous contrast. Multiplanar CT image reconstructions of the cervical spine were also generated.  COMPARISON:  Head and cervical spine CT scan 11/24/2013.  FINDINGS: CT HEAD FINDINGS  There is atrophy and chronic microvascular ischemic change. No evidence of acute intracranial abnormality including hemorrhage, infarct, mass lesion, mass effect, midline shift or abnormal extra-axial fluid collection is identified. There is no hydrocephalus or pneumocephalus. The calvarium is intact. Short air-fluid level in the right maxillary sinus is partially visualized. Mild mucosal thickening is seen the right sphenoid sinus and there is scattered ethmoid air cell disease.  CT CERVICAL SPINE FINDINGS  No fracture or malalignment of the cervical spine is identified. The patient is status post C4-7 fusion. Multilevel degenerative change is noted. The lung apices are clear.  IMPRESSION: No acute intracranial abnormality. Atrophy and chronic microvascular ischemic change are noted.  Partial visualization of mild appearing sinus disease, new since prior exam.  No acute abnormality cervical spine with postoperative change of multilevel degenerative disease identified.   Electronically Signed   By: Drusilla Kanner M.D.   On: 02/12/2014 00:43    Ct Cervical Spine Wo Contrast  02/12/2014   CLINICAL DATA:  Status post fall from bed tonight. Dementia. Initial encounter.  EXAM: CT HEAD WITHOUT CONTRAST  CT CERVICAL SPINE WITHOUT CONTRAST  TECHNIQUE: Multidetector CT imaging of the head and cervical spine was performed following the standard protocol without intravenous contrast. Multiplanar CT image reconstructions of the cervical spine were also generated.  COMPARISON:  Head and cervical spine CT scan 11/24/2013.  FINDINGS: CT HEAD FINDINGS  There is atrophy and chronic microvascular ischemic change. No evidence of acute intracranial abnormality including hemorrhage, infarct, mass lesion, mass effect, midline shift or abnormal extra-axial fluid collection is identified. There is no hydrocephalus or pneumocephalus. The calvarium is intact. Short air-fluid level in the right maxillary sinus is partially visualized. Mild mucosal thickening is seen the right sphenoid sinus and there is scattered ethmoid air cell disease.  CT CERVICAL SPINE FINDINGS  No fracture or malalignment of the cervical spine is identified. The patient is status post C4-7 fusion. Multilevel degenerative change is noted. The lung apices are clear.  IMPRESSION: No acute intracranial abnormality. Atrophy and chronic microvascular ischemic change are noted.  Partial visualization of mild appearing sinus disease, new since prior exam.  No acute abnormality cervical spine with postoperative change of multilevel degenerative disease identified.   Electronically Signed   By: Drusilla Kannerhomas  Dalessio M.D.   On: 02/12/2014 00:43     EKG Interpretation   Date/Time:  Thursday February 12 2014 00:55:00 EST Ventricular Rate:  74 PR Interval:  148 QRS Duration: 82 QT Interval:  408 QTC Calculation: 452 R Axis:   -22 Text Interpretation:  Sinus rhythm with Premature supraventricular  complexes Otherwise normal ECG Similar to prior Confirmed by Mounir Skipper  MD,  Toni AmendOURTNEY ([id number]) on 02/12/2014 2:21:15  AM      MDM   Final diagnoses:  Fall    Patient presents following a fall. No obvious injury on exam. Patient has severe dementia and is noncontributory to history taking. CT head and neck largely unremarkable. Plain films of the pelvis showed no obvious injury. Urinalysis without evidence of urinary tract infection and EKG unchanged from prior. Patient medically cleared to return to facility.  After history, exam, and medical workup I feel the patient has been appropriately medically screened and is safe for discharge home. Pertinent diagnoses were discussed with the patient. Patient was given return precautions.     Shon Batonourtney F Yan Pankratz, MD 02/12/14 [id number]

## 2014-02-12 NOTE — Discharge Instructions (Signed)
Fall Prevention in Hospitals °As a hospital patient, your condition and the treatments you receive can increase your risk for falls. Some additional risk factors for falls in a hospital include: °· Being in an unfamiliar environment. °· Being on bed rest. °· Your surgery. °· Taking certain medicines. °· Your tubing requirements, such as intravenous (IV) therapy or catheters. °It is important that you learn how to decrease fall risks while at the hospital. Below are important tips that can help prevent falls. °SAFETY TIPS FOR PREVENTING FALLS °Talk about your risk of falling. °· Ask your caregiver why you are at risk for falling. Is it your medicine, illness, tubing placement, or something else? °· Make a plan with your caregiver to keep you safe from falls. °· Ask your caregiver or pharmacist about side effect of your medicines. Some medicines can make you dizzy or affect your coordination. °Ask for help. °· Ask for help before getting out of bed. You may need to press your call button. °· Ask for assistance in getting you safely to the toilet. °· Ask for a walker or cane to be put at your bedside. Ask that most of the side rails on your bed be placed up before your caregiver leaves the room. °· Ask family or friends to sit with you. °· Ask for things that are out of your reach, such as your glasses, hearing aids, telephone, bedside table, or call button. °Follow these tips to avoid falling: °· Stay lying or seated, rather than standing, while waiting for help. °· Wear rubber-soled slippers or shoes whenever you walk in the hospital. °· Avoid quick, sudden movements. °¨ Change positions slowly. °¨ Sit on the side of your bed before standing. °¨ Stand up slowly and wait before you start to walk. °· Let your caregiver know if there is a spill on the floor. °· Pay careful attention to the medical equipment, electrical cords, and tubes around you. °· When you need help, use your call button by your bed or in the  bathroom. Wait for one of your caregivers to help you. °· If you feel dizzy or unsure of your footing, return to bed and wait for assistance. °· Avoid being distracted by the TV, telephone, or another person in your room. °· Do not lean or support yourself on rolling objects, such as IV poles or bedside tables. °Document Released: 03/17/2000 Document Revised: 03/06/2012 Document Reviewed: 11/26/2011 °[employer]® Patient Information ©2015 [employer]. This information is not intended to replace advice given to you by your health care provider. Make sure you discuss any questions you have with your health care provider. ° °

## 2014-03-31 ENCOUNTER — Emergency Department (HOSPITAL_COMMUNITY): Payer: Medicare Other

## 2014-03-31 ENCOUNTER — Emergency Department (HOSPITAL_COMMUNITY)
Admission: EM | Admit: 2014-03-31 | Discharge: 2014-03-31 | Disposition: A | Discharge status: Home / Self Care | Payer: Medicare Other | Attending: Emergency Medicine | Admitting: Emergency Medicine

## 2014-03-31 ENCOUNTER — Encounter (HOSPITAL_COMMUNITY): Payer: Self-pay | Admitting: *Deleted

## 2014-03-31 DIAGNOSIS — Z86711 Personal history of pulmonary embolism: Secondary | ICD-10-CM | POA: Diagnosis not present

## 2014-03-31 DIAGNOSIS — Z8719 Personal history of other diseases of the digestive system: Secondary | ICD-10-CM | POA: Diagnosis not present

## 2014-03-31 DIAGNOSIS — F419 Anxiety disorder, unspecified: Secondary | ICD-10-CM | POA: Diagnosis not present

## 2014-03-31 DIAGNOSIS — F0281 Dementia in other diseases classified elsewhere with behavioral disturbance: Secondary | ICD-10-CM | POA: Insufficient documentation

## 2014-03-31 DIAGNOSIS — I4891 Unspecified atrial fibrillation: Secondary | ICD-10-CM | POA: Diagnosis not present

## 2014-03-31 DIAGNOSIS — Z043 Encounter for examination and observation following other accident: Secondary | ICD-10-CM | POA: Insufficient documentation

## 2014-03-31 DIAGNOSIS — G309 Alzheimer's disease, unspecified: Secondary | ICD-10-CM | POA: Insufficient documentation

## 2014-03-31 DIAGNOSIS — Z7901 Long term (current) use of anticoagulants: Secondary | ICD-10-CM | POA: Diagnosis not present

## 2014-03-31 DIAGNOSIS — Y92128 Other place in nursing home as the place of occurrence of the external cause: Secondary | ICD-10-CM | POA: Diagnosis not present

## 2014-03-31 DIAGNOSIS — W1839XA Other fall on same level, initial encounter: Secondary | ICD-10-CM | POA: Insufficient documentation

## 2014-03-31 DIAGNOSIS — Y998 Other external cause status: Secondary | ICD-10-CM | POA: Diagnosis not present

## 2014-03-31 DIAGNOSIS — E079 Disorder of thyroid, unspecified: Secondary | ICD-10-CM | POA: Insufficient documentation

## 2014-03-31 DIAGNOSIS — Y9389 Activity, other specified: Secondary | ICD-10-CM | POA: Diagnosis not present

## 2014-03-31 DIAGNOSIS — Z79899 Other long term (current) drug therapy: Secondary | ICD-10-CM | POA: Diagnosis not present

## 2014-03-31 DIAGNOSIS — F0391 Unspecified dementia with behavioral disturbance: Secondary | ICD-10-CM

## 2014-03-31 DIAGNOSIS — W19XXXA Unspecified fall, initial encounter: Secondary | ICD-10-CM

## 2014-03-31 NOTE — ED Provider Notes (Signed)
This chart was scribed for Tiffany MawKristen N Dhiya Smits, DO by [facility] Has[facility], ED Scribe. The patient was seen in [facility] and the patient's care was started at 1:13 PM.  TIME SEEN: 1:13 PM  CHIEF COMPLAINT: Fall  HPI: [name] is a 80 y.o. female brought in by ambulance, who presents to the Emergency Department complaining of a fall that occurred today. According [facility] staff pt was found laying beside someone's bed. Pt is confused itches her baseline. She has a history as Alzheimer's dementia, pulmonary embolus and is on Xarelto.    ROS: L5 caveat for dementia  PAST MEDICAL HISTORY/PAST SURGICAL HISTORY:  Past Medical History  Diagnosis Date  . Hiatal hernia   . Dementia   . Thyroid disease   . GERD (gastroesophageal reflux disease)   . Diverticulosis of colon (without mention of hemorrhage)     Diverticulosis  . Pulmonary emboli 04/2012  . Anxiety   . Atrial fibrillation   . Hyperlipidemia   . Hypertension     MEDICATIONS:  Prior to Admission medications   Medication Sig Start Date End Date Taking? Authorizing Provider  levETIRAcetam (KEPPRA) 250 MG tablet Take 250 mg by mouth daily.   Yes Historical Provider, MD  levothyroxine (SYNTHROID, LEVOTHROID) 50 MCG tablet Take 50 mcg by mouth daily.    Yes Historical Provider, MD  LORazepam (ATIVAN) 0.5 MG tablet Take 0.5 mg by mouth 2 (two) times daily as needed for anxiety.   Yes Historical Provider, MD  polyethylene glycol (MIRALAX / GLYCOLAX) packet Take 17 g by mouth daily.   Yes Historical Provider, MD  rivaroxaban (XARELTO) 20 MG TABS tablet Take 20 mg by mouth daily.    Yes Historical Provider, MD  acetaminophen (TYLENOL) 500 MG tablet Take 500 mg by mouth every 12 (twelve) hours as needed for mild pain.     Historical Provider, MD  furosemide (LASIX) 40 MG tablet Take 40 mg by mouth daily as needed for fluid.    Historical Provider, MD  loperamide (IMODIUM A-D) 2 MG tablet Take 2 mg by mouth at bedtime. Do not give if  abdominal pain or fever.    Historical Provider, MD    ALLERGIES:  No Known Allergies  SOCIAL HISTORY:  History  Substance Use Topics  . Smoking status: Never Smoker   . Smokeless tobacco: Not on file  . Alcohol Use: No    FAMILY HISTORY: History reviewed. No pertinent family history.  EXAM: BP 122/76 mmHg  Pulse 101  Temp(Src) 96.9 F (36.1 C) (Axillary)  Resp 22  SpO2 98% CONSTITUTIONAL: Alert but unable to answer questions or follow commands, patient has a history of dementia, in cervical collar and on long spineboard HEAD: Normocephalic; atraumatic EYES: Conjunctivae clear, PERRL, EOMI ENT: normal nose; no rhinorrhea; moist mucous membranes; pharynx without lesions noted; no dental injury; no septal hematoma NECK: Supple, no meningismus, no LAD; no midline spinal tenderness, step-off or deformity CARD: RRR; S1 and S2 appreciated; no murmurs, no clicks, no rubs, no gallops RESP: Normal chest excursion without splinting or tachypnea; breath sounds clear and equal bilaterally; no wheezes, no rhonchi, no rales; chest wall stable, nontender to palpation ABD/GI: Normal bowel sounds; non-distended; soft, non-tender, no rebound, no guarding PELVIS:  stable, nontender to palpation BACK:  The back appears normal and is non-tender to palpation, there is no CVA tenderness; no midline spinal tenderness, step-off or deformity EXT: Normal ROM in all joints; non-tender to palpation; no edema; normal capillary refill; no cyanosis  SKIN: Normal color for age and race; warm NEURO: Moves all extremities equally   MEDICAL DECISION MAKING: Patient here after she was found at her nursing home down next to someone's bed. Unwitnessed fall. Patient is on Xarelto. No sign of trauma on exam but exam and history are limited secondary to patient's severe dementia. We'll obtain CT of her head and cervical spine. We'll discuss with son.  ED PROGRESS: Discussed with patient's son [name] at [phone].  He agrees that he would not want labs, urine done at this time. He states the patient sounds like she is at her baseline. He is okay with a CT of her head and cervical spine. If negative would like her discharge back to her nursing facility.    CT scans unremarkable. Patient hemodynamically stable. In no distress, sleeping comfortably. We'll discharge back to nursing facility.    I personally performed the services described in this documentation, which was scribed in my presence. The recorded information has been reviewed and is accurate.     Tiffany MawKristen N [name], DO 03/31/14 1728

## 2014-03-31 NOTE — ED Notes (Signed)
Removed pt from back board.

## 2014-03-31 NOTE — Discharge Instructions (Signed)
Dementia Dementia is a general term for problems with brain function. A person with dementia has memory loss and a hard time with at least one other brain function such as thinking, speaking, or problem solving. Dementia can affect social functioning, how you do your job, your mood, or your personality. The changes may be hidden for a long time. The earliest forms of this disease are usually not detected by family or friends. Dementia can be:  Irreversible.  Potentially reversible.  Partially reversible.  Progressive. This means it can get worse over time. CAUSES  Irreversible dementia causes may include:  Degeneration of brain cells (Alzheimer disease or Lewy body dementia).  Multiple small strokes (vascular dementia).  Infection (chronic meningitis or Creutzfeldt-Jakob disease).  Frontotemporal dementia. This affects younger people, age 40 to 70, compared to those who have Alzheimer disease.  Dementia associated with other disorders like Parkinson disease, Huntington disease, or HIV-associated dementia. Potentially or partially reversible dementia causes may include:  Medicines.  Metabolic causes such as excessive alcohol intake, vitamin B12 deficiency, or thyroid disease.  Masses or pressure in the brain such as a tumor, blood clot, or hydrocephalus. SIGNS AND SYMPTOMS  Symptoms are often hard to detect. Family members or coworkers may not notice them early in the disease process. Different people with dementia may have different symptoms. Symptoms can include:  A hard time with memory, especially recent memory. Long-term memory may not be impaired.  Asking the same question multiple times or forgetting something someone just said.  A hard time speaking your thoughts or finding certain words.  A hard time solving problems or performing familiar tasks (such as how to use a telephone).  Sudden changes in mood.  Changes in personality, especially increasing moodiness or  mistrust.  Depression.  A hard time understanding complex ideas that were never a problem in the past. DIAGNOSIS  There are no specific tests for dementia.   Your health care provider may recommend a thorough evaluation. This is because some forms of dementia can be reversible. The evaluation will likely include a physical exam and getting a detailed history from you and a family member. The history often gives the best clues and suggestions for a diagnosis.  Memory testing may be done. A detailed brain function evaluation called neuropsychologic testing may be helpful.  Lab tests and brain imaging (such as a CT scan or MRI scan) are sometimes important.  Sometimes observation and re-evaluation over time is very helpful. TREATMENT  Treatment depends on the cause.   If the problem is a vitamin deficiency, it may be helped or cured with supplements.  For dementias such as Alzheimer disease, medicines are available to stabilize or slow the course of the disease. There are no cures for this type of dementia.  Your health care provider can help direct you to groups, organizations, and other health care providers to help with decisions in the care of you or your loved one. HOME CARE INSTRUCTIONS The care of individuals with dementia is varied and dependent upon the progression of the dementia. The following suggestions are intended for the person living with, or caring for, the person with dementia.  Create a safe environment.  Remove the locks on bathroom doors to prevent the person from accidentally locking himself or herself in.  Use childproof latches on kitchen cabinets and any place where cleaning supplies, chemicals, or alcohol are kept.  Use childproof covers in unused electrical outlets.  Install childproof devices to keep doors and windows   secured.  Remove stove knobs or install safety knobs and an automatic shut-off on the stove.  Lower the temperature on water  heaters.  Label medicines and keep them locked up.  Secure knives, lighters, matches, power tools, and guns, and keep these items out of reach.  Keep the house free from clutter. Remove rugs or anything that might contribute to a fall.  Remove objects that might break and hurt the person.  Make sure lighting is good, both inside and outside.  Install grab rails as needed.  Use a monitoring device to alert you to falls or other needs for help.  Reduce confusion.  Keep familiar objects and people around.  Use night lights or dim lights at night.  Label items or areas.  Use reminders, notes, or directions for daily activities or tasks.  Keep a simple, consistent routine for waking, meals, bathing, dressing, and bedtime.  Create a calm, quiet environment.  Place large clocks and calendars prominently.  Display emergency numbers and home address near all telephones.  Use cues to establish different times of the day. An example is to open curtains to let the natural light in during the day.   Use effective communication.  Choose simple words and short sentences.  Use a gentle, calm tone of voice.  Be careful not to interrupt.  If the person is struggling to find a word or communicate a thought, try to provide the word or thought.  Ask one question at a time. Allow the person ample time to answer questions. Repeat the question again if the person does not respond.  Reduce nighttime restlessness.  Provide a comfortable bed.  Have a consistent nighttime routine.  Ensure a regular walking or physical activity schedule. Involve the person in daily activities as much as possible.  Limit napping during the day.  Limit caffeine.  Attend social events that stimulate rather than overwhelm the senses.  Encourage good nutrition and hydration.  Reduce distractions during meal times and snacks.  Avoid foods that are too hot or too cold.  Monitor chewing and swallowing  ability.  Continue with routine vision, hearing, dental, and medical screenings.  Give medicines only as directed by the health care provider.  Monitor driving abilities. Do not allow the person to drive when safe driving is no longer possible.  Register with an identification program which could provide location assistance in the event of a missing person situation. SEEK MEDICAL CARE IF:   New behavioral problems start such as moodiness, aggressiveness, or seeing things that are not there (hallucinations).  Any new problem with brain function happens. This includes problems with balance, speech, or falling a lot.  Problems with swallowing develop.  Any symptoms of other illness happen. Small changes or worsening in any aspect of brain function can be a sign that the illness is getting worse. It can also be a sign of another medical illness such as infection. Seeing a health care provider right away is important. SEEK IMMEDIATE MEDICAL CARE IF:   A fever develops.  New or worsened confusion develops.  New or worsened sleepiness develops.  Staying awake becomes hard to do. Document Released: 09/13/2000 Document Revised: 08/04/2013 Document Reviewed: 08/15/2010 [facility] Patient Information 2015 [place], [place]. This information is not intended to replace advice given to you by your health care provider. Make sure you discuss any questions you have with your health care provider.   Possible Head Injury You have received a head injury. It does not appear serious  at this time. Headaches and vomiting are common following head injury. It should be easy to awaken from sleeping. Sometimes it is necessary for you to stay in the emergency department for a while for observation. Sometimes admission to the hospital may be needed. After injuries such as yours, most problems occur within the first 24 hours, but side effects may occur up to 7-10 days after the injury. It is important for you to  carefully monitor your condition and contact your health care provider or seek immediate medical care if there is a change in your condition. WHAT ARE THE TYPES OF HEAD INJURIES? Head injuries can be as minor as a bump. Some head injuries can be more severe. More severe head injuries include:  A jarring injury to the brain (concussion).  A bruise of the brain (contusion). This mean there is bleeding in the brain that can cause swelling.  A cracked skull (skull fracture).  Bleeding in the brain that collects, clots, and forms a bump (hematoma). WHAT CAUSES A HEAD INJURY? A serious head injury is most likely to happen to someone who is in a car wreck and is not wearing a seat belt. Other causes of major head injuries include bicycle or motorcycle accidents, sports injuries, and falls. HOW ARE HEAD INJURIES DIAGNOSED? A complete history of the event leading to the injury and your current symptoms will be helpful in diagnosing head injuries. Many times, pictures of the brain, such as CT or MRI are needed to see the extent of the injury. Often, an overnight hospital stay is necessary for observation.  WHEN SHOULD I SEEK IMMEDIATE MEDICAL CARE?  You should get help right away if:  You have confusion or drowsiness.  You feel sick to your stomach (nauseous) or have continued, forceful vomiting.  You have dizziness or unsteadiness that is getting worse.  You have severe, continued headaches not relieved by medicine. Only take over-the-counter or prescription medicines for pain, fever, or discomfort as directed by your health care provider.  You do not have normal function of the arms or legs or are unable to walk.  You notice changes in the black spots in the center of the colored part of your eye (pupil).  You have a clear or bloody fluid coming from your nose or ears.  You have a loss of vision. During the next 24 hours after the injury, you must stay with someone who can watch you for the  warning signs. This person should contact local emergency services (911 in the [place] if you have seizures, you become unconscious, or you are unable to wake up. HOW CAN I PREVENT A HEAD INJURY IN THE FUTURE? The most important factor for preventing major head injuries is avoiding motor vehicle accidents. To minimize the potential for damage to your head, it is crucial to wear seat belts while riding in motor vehicles. Wearing helmets while bike riding and playing collision sports (like football) is also helpful. Also, avoiding dangerous activities around the house will further help reduce your risk of head injury.  WHEN CAN I RETURN TO NORMAL ACTIVITIES AND ATHLETICS? You should be reevaluated by your health care provider before returning to these activities. If you have any of the following symptoms, you should not return to activities or contact sports until 1 week after the symptoms have stopped:  Persistent headache.  Dizziness or vertigo.  Poor attention and concentration.  Confusion.  Memory problems.  Nausea or vomiting.  Fatigue or tire easily.  Irritability.  Intolerant of bright lights or loud noises.  Anxiety or depression.  Disturbed sleep. MAKE SURE YOU:   Understand these instructions.  Will watch your condition.  Will get help right away if you are not doing well or get worse. Document Released: 03/20/2005 Document Revised: 03/25/2013 Document Reviewed: 11/25/2012 [facility] Patient Information 2015 [place], [place]. This information is not intended to replace advice given to you by your health care provider. Make sure you discuss any questions you have with your health care provider.

## 2014-03-31 NOTE — ED Notes (Signed)
Called report to [facility]'s [facility] Pearl[facility]ira [facility]. They will provide transportation.

## 2014-03-31 NOTE — ED Notes (Signed)
EMS reports being called out for a possible fall.  [facility], pt was found laying beside someone else's bed.  Per their protocol, the pt must be sent to ED for evaluation.

## 2014-05-22 ENCOUNTER — Emergency Department (HOSPITAL_COMMUNITY)
Admission: EM | Admit: 2014-05-22 | Discharge: 2014-05-22 | Disposition: A | Discharge status: Home / Self Care | Attending: Emergency Medicine | Admitting: Emergency Medicine

## 2014-05-22 ENCOUNTER — Emergency Department (HOSPITAL_COMMUNITY)

## 2014-05-22 ENCOUNTER — Encounter (HOSPITAL_COMMUNITY): Payer: Self-pay | Admitting: Emergency Medicine

## 2014-05-22 DIAGNOSIS — S8011XA Contusion of right lower leg, initial encounter: Secondary | ICD-10-CM | POA: Insufficient documentation

## 2014-05-22 DIAGNOSIS — Y998 Other external cause status: Secondary | ICD-10-CM | POA: Insufficient documentation

## 2014-05-22 DIAGNOSIS — S8012XA Contusion of left lower leg, initial encounter: Secondary | ICD-10-CM | POA: Insufficient documentation

## 2014-05-22 DIAGNOSIS — Y92198 Other place in other specified residential institution as the place of occurrence of the external cause: Secondary | ICD-10-CM | POA: Diagnosis not present

## 2014-05-22 DIAGNOSIS — G309 Alzheimer's disease, unspecified: Secondary | ICD-10-CM | POA: Diagnosis not present

## 2014-05-22 DIAGNOSIS — W19XXXA Unspecified fall, initial encounter: Secondary | ICD-10-CM

## 2014-05-22 DIAGNOSIS — Y9301 Activity, walking, marching and hiking: Secondary | ICD-10-CM | POA: Diagnosis not present

## 2014-05-22 DIAGNOSIS — S8991XA Unspecified injury of right lower leg, initial encounter: Secondary | ICD-10-CM | POA: Diagnosis present

## 2014-05-22 DIAGNOSIS — W1839XA Other fall on same level, initial encounter: Secondary | ICD-10-CM | POA: Insufficient documentation

## 2014-05-22 HISTORY — DX: Unspecified osteoarthritis, unspecified site: M19.90

## 2014-05-22 HISTORY — DX: Acute myocardial infarction, unspecified: I21.9

## 2014-05-22 HISTORY — DX: Other pulmonary embolism without acute cor pulmonale: I26.99

## 2014-05-22 HISTORY — DX: Dementia in other diseases classified elsewhere, unspecified severity, without behavioral disturbance, psychotic disturbance, mood disturbance, and anxiety: F02.80

## 2014-05-22 HISTORY — DX: Atherosclerotic heart disease of native coronary artery without angina pectoris: I25.10

## 2014-05-22 HISTORY — DX: Alzheimer's disease, unspecified: G30.9

## 2014-05-22 NOTE — ED Notes (Signed)
Fell at [facility], in the common area. Possible she was walking at the time, not certain. [place]ers pt. Confused and moaning, shouting out. No obvious deformity of limbs, or shortening of legs or hip.

## 2014-05-22 NOTE — ED Notes (Signed)
Discharge instructions given, pt caregiver from [facility] demonstrated teach back and verbal understanding. No concerns voiced.

## 2014-05-22 NOTE — Discharge Instructions (Signed)
Follow up with your md next week. °

## 2014-05-22 NOTE — ED Provider Notes (Addendum)
CSN: [id number]     Arrival date & time 05/22/14  2123 History   This chart was scribed for Benny LennertJoseph L Erienne Spelman, MD by Gwenyth Oberatherine Macek, ED Scribe. This patient was seen in room APA17/APA17 and the patient's care was started at 9:33 PM.   Chief Complaint  Patient presents with  . Fall   HPI Comments: LEVEL 5 CAVEAT: ALTERED MENTAL STATUS  Patient is a 81 y.o. female presenting with fall. The history is provided by the EMS personnel (pt found alert on the floor). No language interpreter was used.  Fall This is a new problem. The current episode started 1 to 2 hours ago. The problem occurs rarely. The problem has not changed since onset.Pertinent negatives include no chest pain and no abdominal pain. Nothing aggravates the symptoms. She has tried nothing for the symptoms. The treatment provided no relief.    HPI Comments: [name] is a 81 y.o. female and [facility] [address], with a history of Alzheimer's disease, who presents to the Emergency Department after she fell in the common area of [address] [facility]. She may have been ambulating prior to the fall.   History reviewed. No pertinent past medical history. No past surgical history on file. No family history on file. History  Substance Use Topics  . Smoking status: Not on file  . Smokeless tobacco: Not on file  . Alcohol Use: Not on file   OB History    No data available     Review of Systems  Unable to perform ROS: Mental status change  Cardiovascular: Negative for chest pain.  Gastrointestinal: Negative for abdominal pain.   Allergies  Review of patient's allergies indicates not on file.  Home Medications   Prior to Admission medications   Not on File   BP 123/62 mmHg  Pulse 86  Temp(Src) 97.8 F (36.6 C) (Oral)  Resp 17  Ht 5\' 2"  (1.575 m)  Wt 126 lb (57.153 kg)  BMI 23.04 kg/m2  SpO2 97% Physical Exam  Constitutional: She appears well-developed.  HENT:  Head: Normocephalic.  Dry mucous membranes  Eyes:  Conjunctivae and EOM are normal. No scleral icterus.  Neck: Neck supple. No thyromegaly present.  Cardiovascular: Normal rate and regular rhythm.  Exam reveals no gallop and no friction rub.   No murmur heard. Pulmonary/Chest: No stridor. She has no wheezes. She has no rales. She exhibits no tenderness.  Abdominal: She exhibits no distension. There is no tenderness. There is no rebound.  Musculoskeletal: Normal range of motion. She exhibits no edema.  Multiple bruises on both legs  Lymphadenopathy:    She has no cervical adenopathy.  Neurological: She is alert. She exhibits normal muscle tone. Coordination normal.  Oriented to person only  Skin: No rash noted. No erythema.  Psychiatric: She has a normal mood and affect. Her behavior is normal.  Nursing note and vitals reviewed.   ED Course  Procedures (including critical care time) DIAGNOSTIC STUDIES: Oxygen Saturation is 97% on RA, normal by my interpretation.    Labs Review Labs Reviewed - No data to display  Imaging Review No results found.   EKG Interpretation None      MDM   Final diagnoses:  None   Fall,   Neg x-rays  The chart was scribed for me under my direct supervision.  I personally performed the history, physical, and medical decision making and all procedures in the evaluation of this patient.Benny Lennert.    Oluwatimileyin Vivier L Abie Cheek, MD 05/22/14 2231  Benny Lennert, MD 05/22/14 2232

## 2014-05-22 NOTE — ED Notes (Signed)
Notified [facility] of pt pending Discharge. Attendant from facility to come pick up patient.

## 2014-05-25 ENCOUNTER — Emergency Department (HOSPITAL_COMMUNITY)

## 2014-05-25 ENCOUNTER — Encounter (HOSPITAL_COMMUNITY): Payer: Self-pay

## 2014-05-25 ENCOUNTER — Emergency Department (HOSPITAL_COMMUNITY)
Admission: EM | Admit: 2014-05-25 | Discharge: 2014-05-25 | Disposition: A | Discharge status: Home / Self Care | Attending: Emergency Medicine | Admitting: Emergency Medicine

## 2014-05-25 DIAGNOSIS — Z8639 Personal history of other endocrine, nutritional and metabolic disease: Secondary | ICD-10-CM | POA: Insufficient documentation

## 2014-05-25 DIAGNOSIS — F0390 Unspecified dementia without behavioral disturbance: Secondary | ICD-10-CM | POA: Diagnosis not present

## 2014-05-25 DIAGNOSIS — Z86711 Personal history of pulmonary embolism: Secondary | ICD-10-CM | POA: Diagnosis not present

## 2014-05-25 DIAGNOSIS — S79912D Unspecified injury of left hip, subsequent encounter: Secondary | ICD-10-CM | POA: Diagnosis present

## 2014-05-25 DIAGNOSIS — F419 Anxiety disorder, unspecified: Secondary | ICD-10-CM | POA: Insufficient documentation

## 2014-05-25 DIAGNOSIS — W19XXXA Unspecified fall, initial encounter: Secondary | ICD-10-CM

## 2014-05-25 DIAGNOSIS — Z79899 Other long term (current) drug therapy: Secondary | ICD-10-CM | POA: Diagnosis not present

## 2014-05-25 DIAGNOSIS — I10 Essential (primary) hypertension: Secondary | ICD-10-CM | POA: Insufficient documentation

## 2014-05-25 DIAGNOSIS — S79911D Unspecified injury of right hip, subsequent encounter: Secondary | ICD-10-CM | POA: Diagnosis not present

## 2014-05-25 DIAGNOSIS — E079 Disorder of thyroid, unspecified: Secondary | ICD-10-CM | POA: Diagnosis not present

## 2014-05-25 DIAGNOSIS — W1830XD Fall on same level, unspecified, subsequent encounter: Secondary | ICD-10-CM | POA: Diagnosis not present

## 2014-05-25 DIAGNOSIS — K219 Gastro-esophageal reflux disease without esophagitis: Secondary | ICD-10-CM | POA: Diagnosis not present

## 2014-05-25 NOTE — ED Notes (Signed)
Per EMS, [facility] having pain in her left leg. Pt fell Friday and was eval here for same. Pt moans and screams when let leg is touched

## 2014-05-25 NOTE — Progress Notes (Addendum)
ED RN Visit Efrain SellaPearl Baray- [facility] ([facility]) Francesco RunnerK Robertson RN [facility] RN Baxter HireKristen that patient had been transported to [facility] ED via EMS for evaluation of left leg pain. Patient had been previously evaluated and released after a fall on Friday. Patient seen at bedside, lying on ED stretcher, easily awakened to name, oriented to person, minimally verbal. Patient appears calm, no noverbal s/s of pain noted. Per chart review xrays performed this am were inconclusive, plan is for patient to have a CT of her left hip and has a consult to orthopedic surgery. Writer spoke with staff RN Hilbert CorriganLorrie and medication list reviewed with staff pharmacy tech Katie. Patient taken to CT at end of visit. [facility] will continue to follow. Please call [phone] with any hospice needs. No family present at time of visit.  Thank you. Dayna BarkerKaren Robertson RN, BSB, [facility]) Hospital Liaison RN [phone])

## 2014-05-25 NOTE — ED Notes (Signed)
[facility] given to [facility]. States they will bring pt back via car

## 2014-05-25 NOTE — ED Provider Notes (Signed)
CSN: [id number]     Arrival date & time    History  This chart was scribed for Vida Roller, MD by Ronney Lion, ED Scribe. This patient was seen in room APA12/APA12 and the patient's care was started at 10:13 AM.    Chief Complaint  Patient presents with  . Leg Pain   The history is provided by the nursing home and the EMS personnel. No language interpreter was used.     LEVEL 5 CAVEAT DUE TO SEVERE DEMENTIA  HPI Comments: [name] is a 81 y.o. female with a history of chronic dementia brought in by ambulance, who presents to the Emergency Department. According to staff at a nursing facility [facility]), patient was transported to the ED for a fall several days ago, evaluated, and sent home. Patient was found today with left leg rotation that is not baseline and resistance to exam. She is screaming in pain. Unable to give history, as patient is not able to talk comprehensibly.   Per chart review, patient was seen 05/22/14 after having a fall in the common area at the [facility] facility. Her hip XRs show she has a left hip replacement, which appeared normal on X-ray. Patient also had a CT head and CT spine that were unremarkable. She was released, per medical record. (Chart merge necessary from that visit.) Patient's hip surgeon in 2008 was Dr. Jerl Santos at [facility], per chart review.   Past Medical History  Diagnosis Date  . Hiatal hernia   . Dementia   . Thyroid disease   . GERD (gastroesophageal reflux disease)   . Diverticulosis of colon (without mention of hemorrhage)     Diverticulosis  . Pulmonary emboli 04/2012  . Anxiety   . Atrial fibrillation   . Hyperlipidemia   . Hypertension    Past Surgical History  Procedure Laterality Date  . Cholecystectomy    . Abdominal surgery     No family history on file. History  Substance Use Topics  . Smoking status: Never Smoker   . Smokeless tobacco: Not on file   . Alcohol Use: No   OB History    No data available     Review of Systems  Unable to perform ROS: Dementia      Allergies  Review of patient's allergies indicates no known allergies.  Home Medications   Prior to Admission medications   Medication Sig Start Date End Date Taking? Authorizing Provider  acetaminophen (TYLENOL) 500 MG tablet Take 500 mg by mouth 3 (three) times daily.    Yes Historical Provider, MD  guaiFENesin (ROBITUSSIN) 100 MG/5ML liquid Take 300 mg by mouth 3 (three) times daily. For 4 weeks then change order on 06/04/14 to prn for cough   Yes Historical Provider, MD  levETIRAcetam (KEPPRA) 250 MG tablet Take 250 mg by mouth daily.   Yes Historical Provider, MD  levothyroxine (SYNTHROID, LEVOTHROID) 50 MCG tablet Take 50 mcg by mouth daily.    Yes Historical Provider, MD  LORazepam (ATIVAN) 0.5 MG tablet Take 0.5 mg by mouth as directed. MAR states twice a day routinely and twice daily as needed for anxiety/agitation   Yes Historical Provider, MD  omeprazole (PRILOSEC) 20 MG capsule Take 20 mg by mouth daily.   Yes Historical Provider, MD  furosemide (LASIX) 40 MG tablet Take 40 mg by mouth daily as needed for fluid.    Historical Provider, MD  loperamide (IMODIUM A-D) 2 MG  tablet Take 2 mg by mouth at bedtime. Do not give if abdominal pain or fever.    Historical Provider, MD  polyethylene glycol (MIRALAX / GLYCOLAX) packet Take 17 g by mouth daily.    Historical Provider, MD   BP 125/72 mmHg  Pulse 77  Resp 16  SpO2 98% Physical Exam  Constitutional: She appears well-developed and well-nourished. No distress.  HENT:  Head: Normocephalic and atraumatic.  Mouth/Throat: Oropharynx is clear and moist. No oropharyngeal exudate.  Eyes: Conjunctivae and EOM are normal. Pupils are equal, round, and reactive to light. Right eye exhibits no discharge. Left eye exhibits no discharge. No scleral icterus.  Neck: Normal range of motion. Neck supple. No JVD present. No  thyromegaly present.  Cardiovascular: Normal rate, regular rhythm, normal heart sounds and intact distal pulses.  Exam reveals no gallop and no friction rub.   No murmur heard. Pulmonary/Chest: Effort normal and breath sounds normal. No respiratory distress. She has no wheezes. She has no rales.  Abdominal: Soft. Bowel sounds are normal. She exhibits no distension and no mass. There is no tenderness.  Musculoskeletal: Normal range of motion. She exhibits no edema or tenderness.  Left leg internally rotated. Pain with ROM of bilateral hips, left greater than right. Pedal pulses are 2+. No asymmetry or appreciable edema.  Lymphadenopathy:    She has no cervical adenopathy.  Neurological: She is alert. Coordination normal.  Skin: Skin is warm and dry. No rash noted. She is not diaphoretic. No erythema.  Psychiatric: She has a normal mood and affect. Her behavior is normal.  Baseline demented state.  Nursing note and vitals reviewed.   ED Course  Procedures (including critical care time)  DIAGNOSTIC STUDIES: Oxygen Saturation is 100% on room air, normal by my interpretation.     Labs Review Labs Reviewed - No data to display  Imaging Review Ct Hip Left Wo Contrast  05/25/2014   CLINICAL DATA:  Status post fall 05/22/2014 with left hip pain. History of prior hip replacement.  EXAM: CT OF THE LEFT HIP WITHOUT CONTRAST  TECHNIQUE: Multidetector CT imaging of the left hip was performed according to the standard protocol. Multiplanar CT image reconstructions were also generated.  COMPARISON:  Plain films of left of this same date.  FINDINGS: Bipolar hemiarthroplasty is in place. The device is located. Streak artifact somewhat limits visualization, but no fracture is identified. No focal bony lesion is seen. Bones appear osteopenic. No evidence of loosening of the patient's prosthesis is identified. Imaged intrapelvic contents demonstrate no focal abnormality.  IMPRESSION: No acute abnormality.  Status post left hip replacement without evidence of complication.   Electronically Signed   By: Drusilla Kanner M.D.   On: 05/25/2014 12:59   Dg Hip Unilat With Pelvis 2-3 Views Left  05/25/2014   CLINICAL DATA:  Pain following fall several days ago  EXAM: LEFT HIP (WITH PELVIS) 2-3 VIEWS  COMPARISON:  February 12, 2014  FINDINGS: Frontal pelvis as well as frontal and lateral left hip images were obtained. There is a total hip prosthesis on the left which appears well seated. On the lateral view, there is a lucency located medial to the proximal femoral component of the prosthesis which may represent a subtle incomplete fracture within this cement in this area. There is no other evidence suggesting fracture. No dislocation. Bones diffusely osteoporotic. There is moderate narrowing of the right hip joint, stable.  IMPRESSION: Question incomplete fracture within the cement just medial to the prosthesis in the  proximal most aspect of the femur medially. No other findings suggesting potential fracture. No dislocation. Prosthesis appears well-seated. Bones diffusely osteoporotic. Narrowing right hip joint.   Electronically Signed   By: Bretta BangWilliam  Woodruff III M.D.   On: 05/25/2014 10:56      MDM   Final diagnoses:  Fall    The patient has been resting comfortably since arrival, she does not like to be examined and will push and kick when attempted. She otherwise does not appear to have any x-ray findings of fracture, I discussed her care with her orthopedist who recommended CT scan, the CT scan confirms no obvious fracture. The patient is stable for discharge back to her nursing facility.  I personally performed the services described in this documentation, which was scribed in my presence. The recorded information has been reviewed and is accurate.        Vida RollerBrian D Arbie Reisz, MD 05/25/14 [phone]

## 2014-05-25 NOTE — Discharge Instructions (Signed)
[name] has no fractures of the hip - a CT scan was performed and  Shows no fractures  Please call your doctor for a followup appointment within 24-48 hours. When you talk to your doctor please let them know that you were seen in the emergency department and have them acquire all of your records so that they can discuss the findings with you and formulate a treatment plan to fully care for your new and ongoing problems.

## 2014-05-29 ENCOUNTER — Encounter (HOSPITAL_COMMUNITY): Payer: Self-pay

## 2014-08-02 DEATH — deceased

## 2016-08-13 IMAGING — CT CT HEAD W/O CM
4 of 5 series · 14 of 47 positions shown, 15 images · non-contrast
Comparison: None.

CLINICAL DATA: Status post fall; history of dementia

EXAM:
CT HEAD WITHOUT CONTRAST
CT CERVICAL SPINE WITHOUT CONTRAST
TECHNIQUE: Multidetector CT imaging of the head and cervical spine was
performed following the standard protocol without intravenous
contrast. Multiplanar CT image reconstructions of the cervical spine
were also generated.

[Series 2: headseq 4.8 h37s · axial · 0.47mm/px · z∈[+217,+275]mm · 2 of 36 slices shown, 3 images]
[im 12/36  brain]
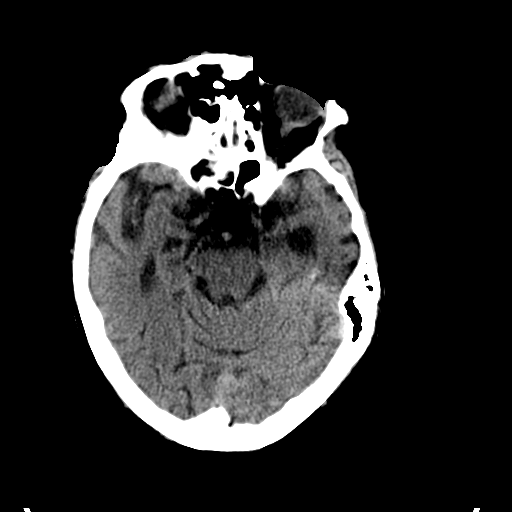
[im 12/36  bone]
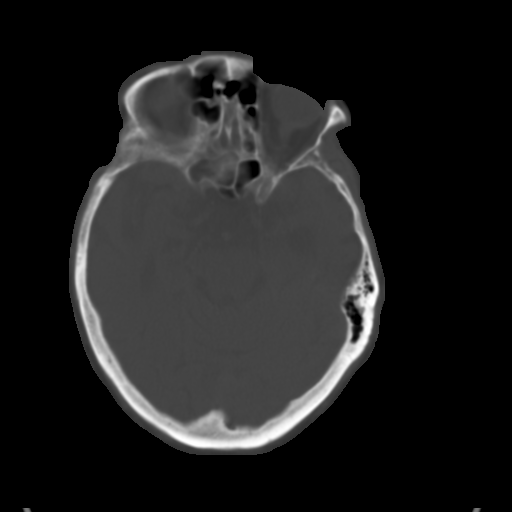
[im 24/36  brain]
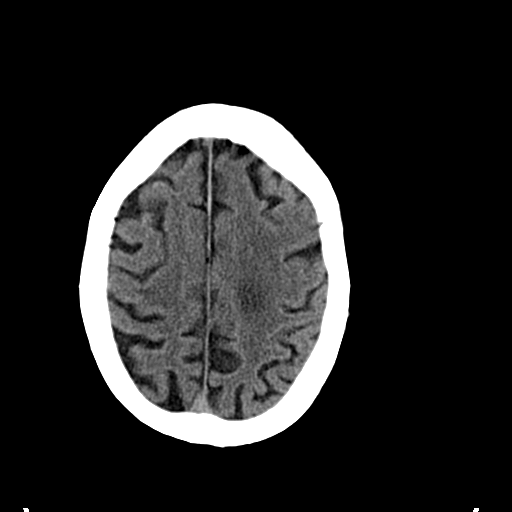

[Series 7: sagittal bone 2.0 · sagittal · 0.31mm/px · 3 of 62 slices shown]
[im 21/62  brain]
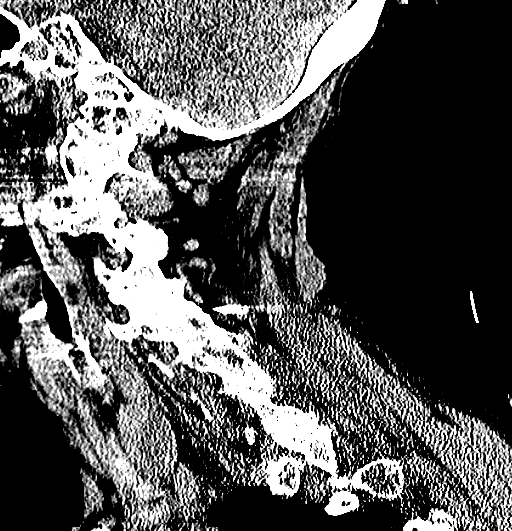
[im 31/62  brain]
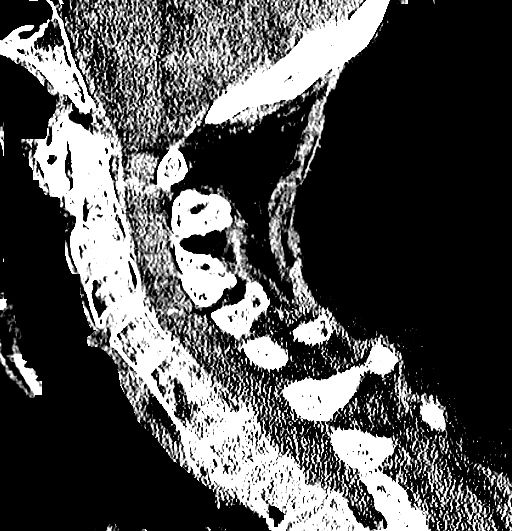
[im 41/62  brain]
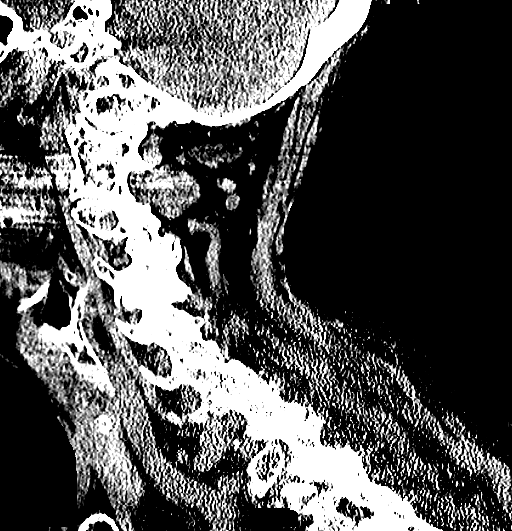

[Series 8: coronal bone 2.0 · coronal · 0.24mm/px · 3 of 54 slices shown]
[im 18/54  brain]
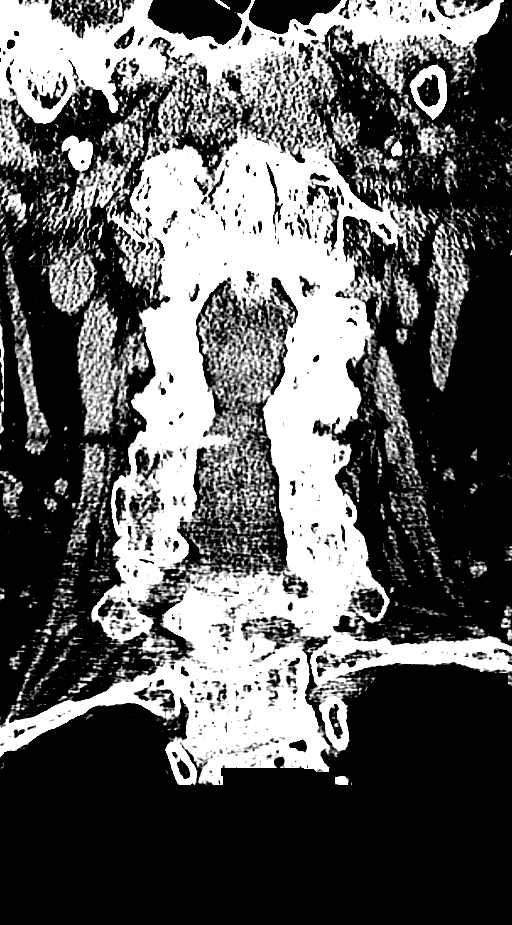
[im 24/54  brain]
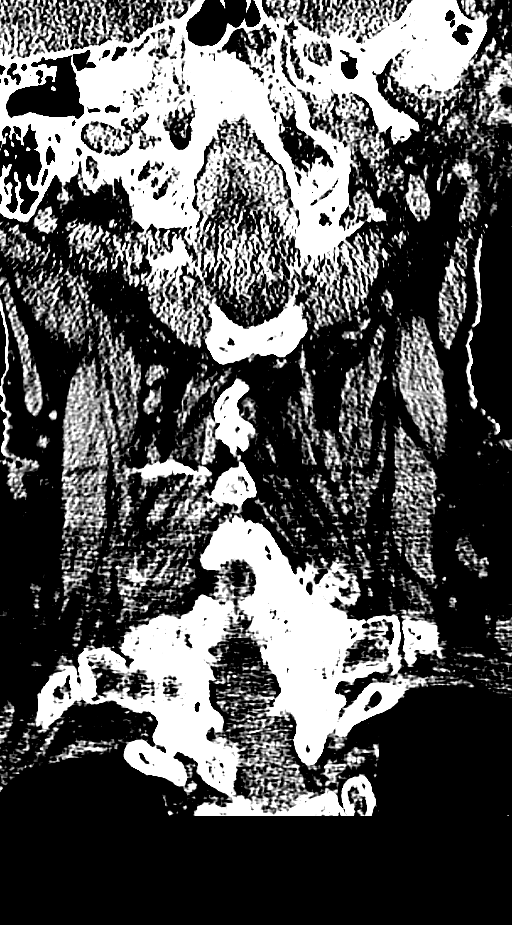
[im 30/54  brain]
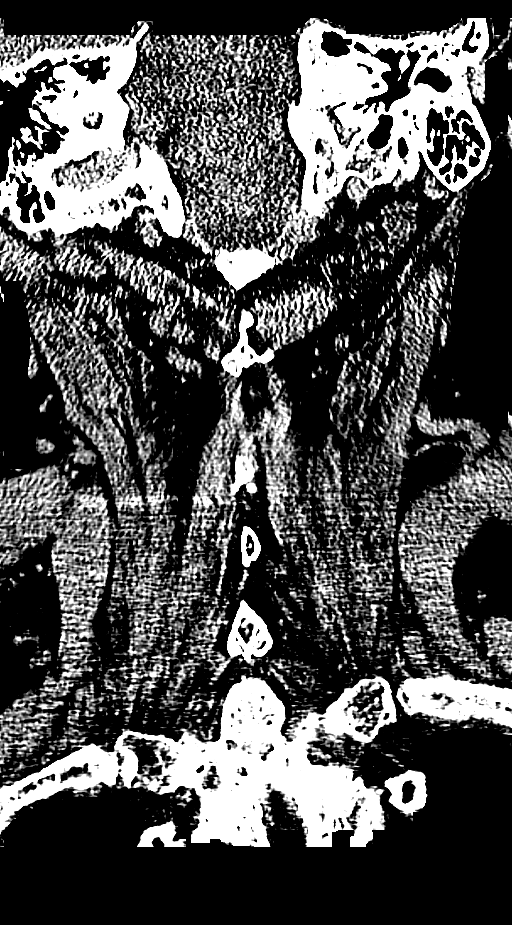

[Series 9: axial bone 2.0 · axial · 0.21mm/px · z∈[-10,+99]mm · 6 of 114 slices shown]
[im 9/114  bone]
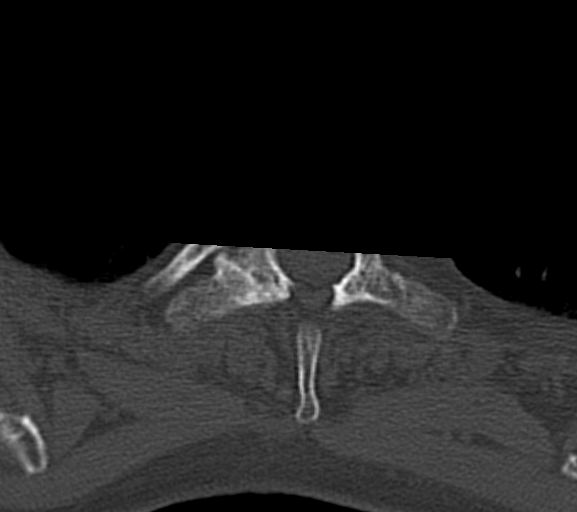
[im 25/114  bone]
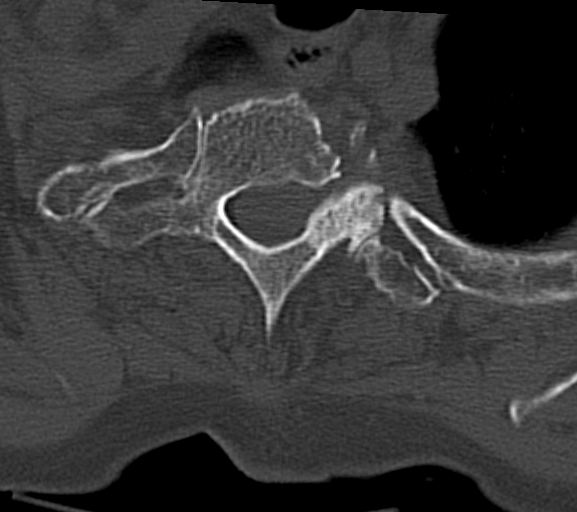
[im 41/114  bone]
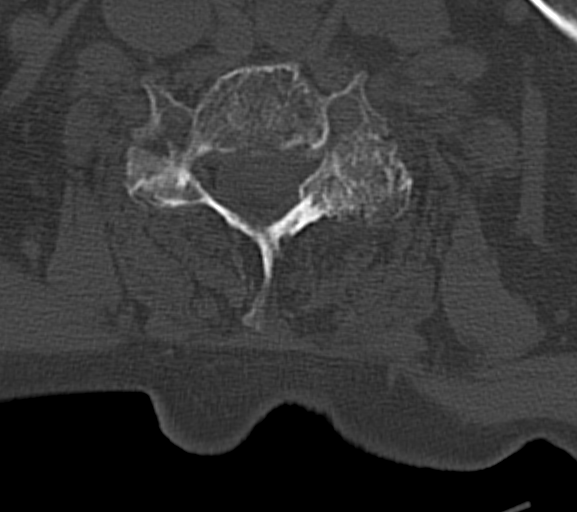
[im 49/114  bone]
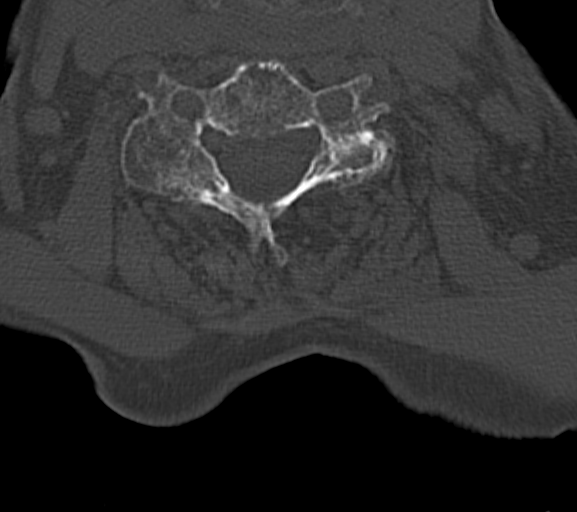
[im 65/114  bone]
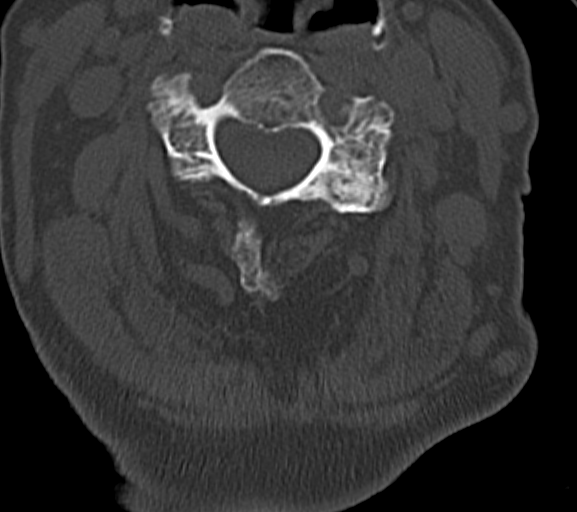
[im 73/114  bone]
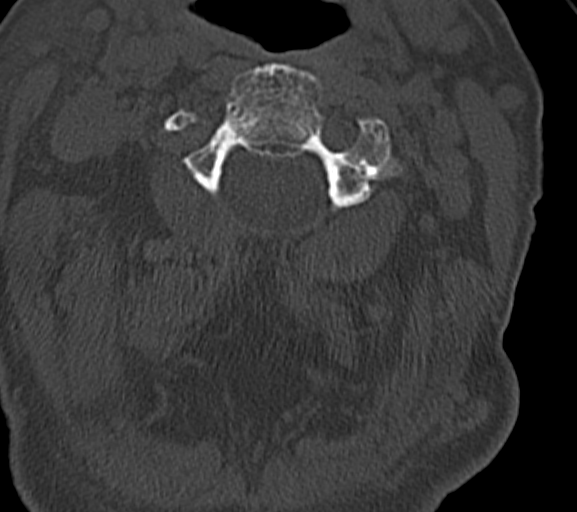

[14 of 47 positions shown; findings below may reference images not displayed]

FINDINGS: CT HEAD FINDINGS

There is mild diffuse cerebral and cerebellar atrophy with
compensatory ventriculomegaly. There is decreased density in the
deep white matter of both cerebral hemispheres consistent with
chronic small vessel ischemic change. There is punctate basal
ganglia calcification on the right. There is no acute intracranial
hemorrhage nor evidence of an acute ischemic event.

The observed paranasal sinuses and mastoid air cells are clear.
There is no acute skull fracture. There is no definite
cephalohematoma.

CT CERVICAL SPINE FINDINGS

There is chronic fusion across the C5-6 disc level. There is partial
fusion across the C4-5 and C6-7 disc levels. The prevertebral soft
tissue spaces are unremarkable. There is moderate degenerative facet
joint change at multiple levels with fusion at C4-5. And C5-6 on the
right and at C6- C7 on the left. The spinous processes are intact.
There is severe degenerative change of the atlanto dens
articulation. The ring of C1 is intact. The observed first and
second ribs are also are intact.

There are calcified nodules in both thyroid lobes.
IMPRESSION: 1. There is no acute intracranial hemorrhage nor evidence of other
acute intracranial injury.
2. There is mild diffuse atrophy with compensatory ventriculomegaly.
3. There is no acute skull fracture.
4. There is no acute cervical spine fracture nor dislocation. There
are extensive chronic changes of the mid cervical spine involving
the discs and facet joints.
5. There are calcified nodules in both thyroid lobes. Elective
thyroid ultrasound is recommended when the patient can tolerate the
procedure.

## 2016-11-01 IMAGING — CR DG PELVIS 1-2V
2 series · 2 of 2 positions shown · non-contrast
Comparison: None.

CLINICAL DATA: The patient fell from bed tonight. Pain. History of
hip replacement. Initial encounter.

EXAM:
PELVIS - 1-2 VIEW

[view not recorded (1 of 2)]
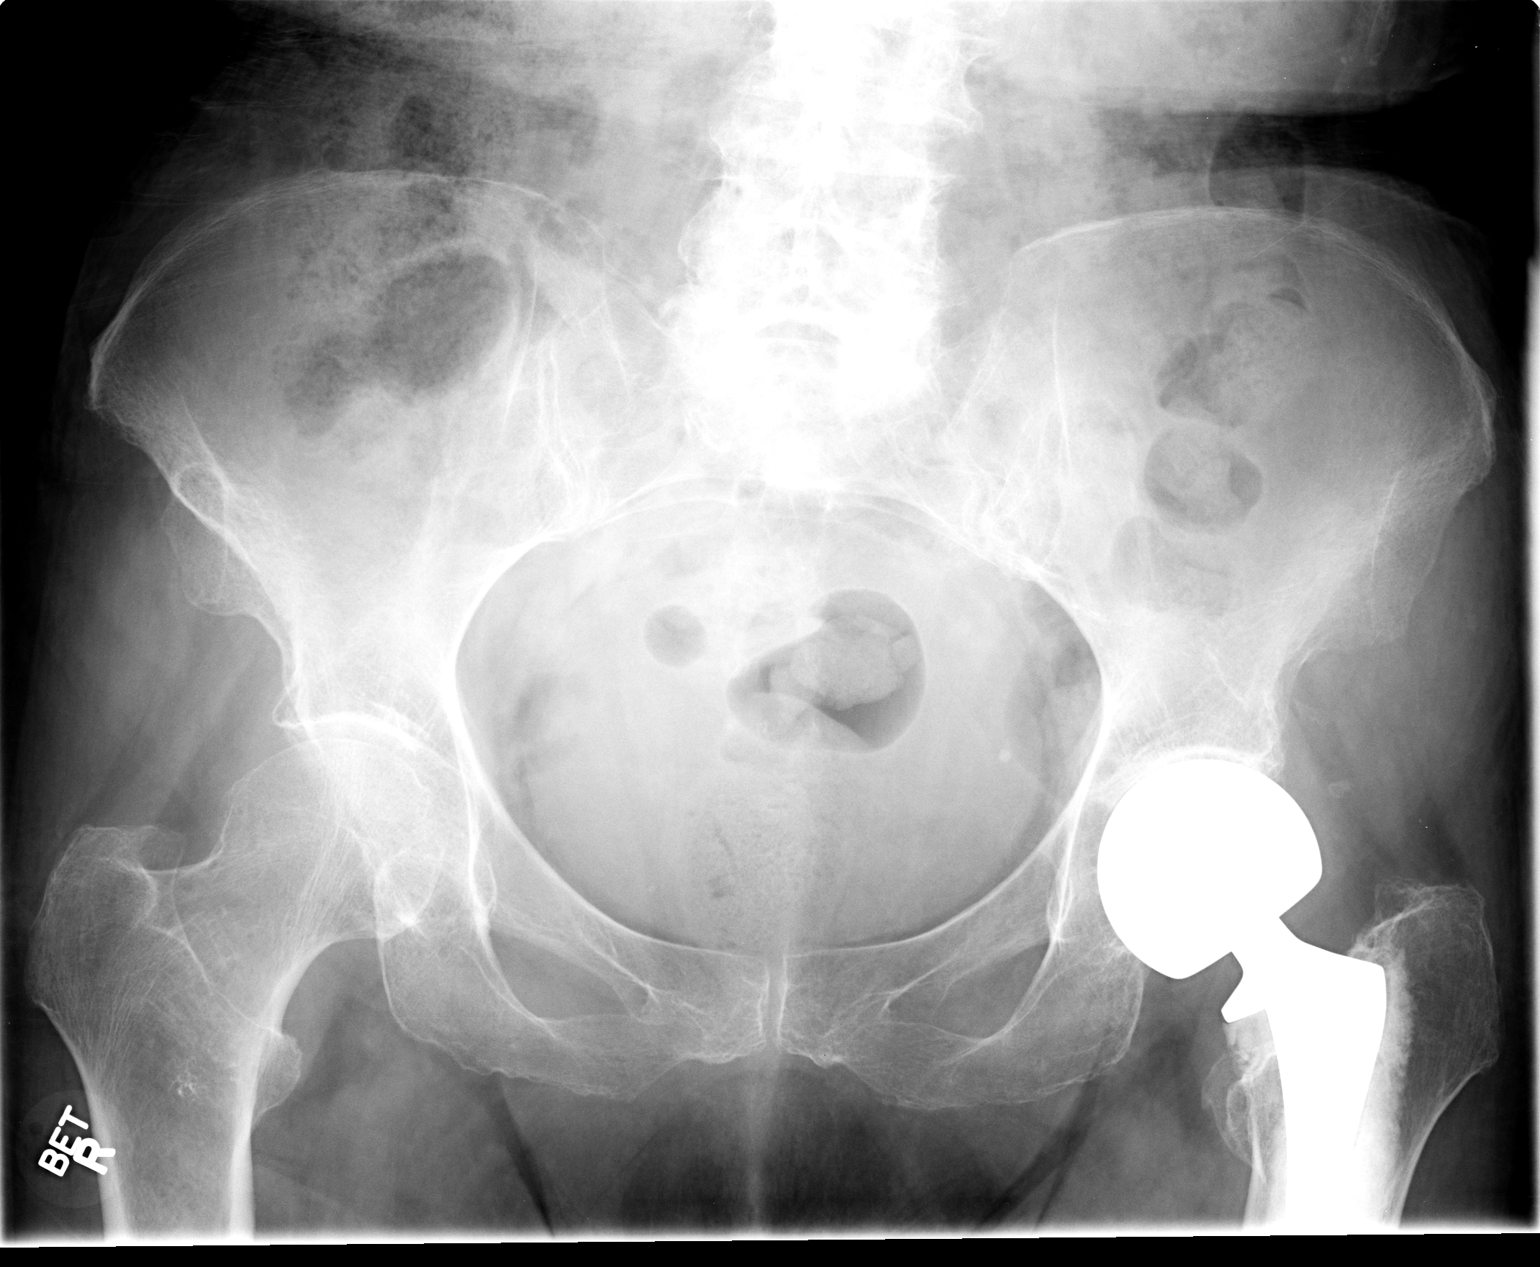

[view not recorded (2 of 2)]
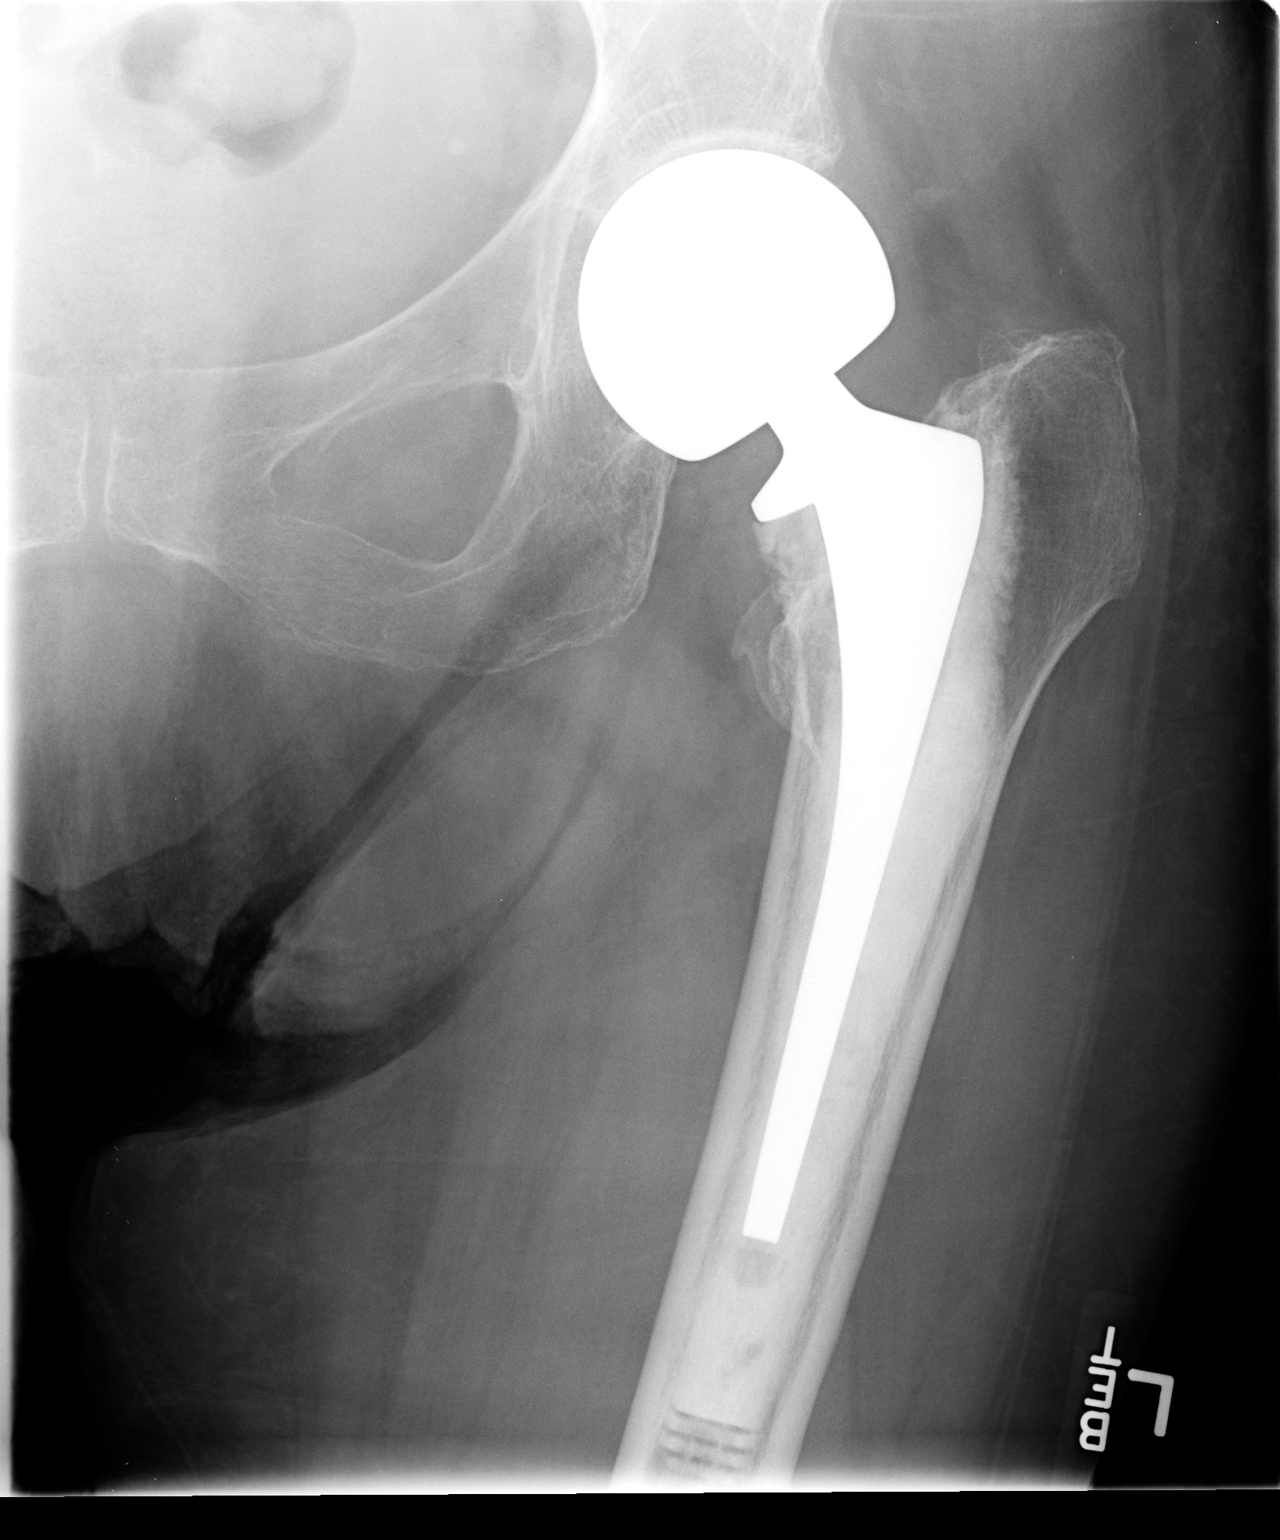

[2 of 2 positions shown; findings below may reference images not displayed]

FINDINGS: Bipolar left hip hemiarthroplasty is in place. The hips are located.
No fracture is identified.
IMPRESSION: No acute abnormality.
